# Patient Record
Sex: Female | Born: 1940
Health system: Southern US, Community
[De-identification: ages and names within clinical notes are randomized; demographics above are authoritative.]

## PROBLEM LIST (undated history)

## (undated) DIAGNOSIS — R7303 Prediabetes: Secondary | ICD-10-CM

## (undated) DIAGNOSIS — F419 Anxiety disorder, unspecified: Secondary | ICD-10-CM

## (undated) DIAGNOSIS — I10 Essential (primary) hypertension: Secondary | ICD-10-CM

## (undated) DIAGNOSIS — E7800 Pure hypercholesterolemia, unspecified: Secondary | ICD-10-CM

## (undated) DIAGNOSIS — F32A Depression, unspecified: Secondary | ICD-10-CM

## (undated) DIAGNOSIS — E039 Hypothyroidism, unspecified: Secondary | ICD-10-CM

## (undated) DIAGNOSIS — R112 Nausea with vomiting, unspecified: Secondary | ICD-10-CM

## (undated) DIAGNOSIS — Z9889 Other specified postprocedural states: Secondary | ICD-10-CM

## (undated) DIAGNOSIS — M1990 Unspecified osteoarthritis, unspecified site: Secondary | ICD-10-CM

## (undated) HISTORY — PX: KNEE ARTHROSCOPY: SUR90

## (undated) HISTORY — DX: Unspecified osteoarthritis, unspecified site: M19.90

## (undated) HISTORY — DX: Essential (primary) hypertension: I10

## (undated) HISTORY — PX: ANTERIOR CERVICAL DECOMP/DISCECTOMY FUSION: SHX1161

## (undated) HISTORY — PX: VAGINAL HYSTERECTOMY: SHX2639

## (undated) HISTORY — PX: CATARACT EXTRACTION: SUR2

## (undated) HISTORY — PX: GALLBLADDER SURGERY: SHX652

## (undated) HISTORY — DX: Pure hypercholesterolemia, unspecified: E78.00

## (undated) HISTORY — PX: VITRECTOMY: SHX106

---

## 2007-02-06 ENCOUNTER — Inpatient Hospital Stay (HOSPITAL_COMMUNITY): Admission: RE | Admit: 2007-02-06 | Discharge: 2007-02-10 | Payer: Self-pay | Admitting: Orthopedic Surgery

## 2010-10-18 NOTE — Op Note (Signed)
Kayla Bruce, Kayla Bruce NO.:  [id number]   MEDICAL RECORD NO.:  [id number]          PATIENT TYPE:  INP   LOCATION:  X002                         FACILITY:  [facility]   PHYSICIAN:  Georges Lynch. Gioffre, M.D.DATE OF BIRTH:  04-14-1941   DATE OF PROCEDURE:  02/06/2007  DATE OF DISCHARGE:                               OPERATIVE REPORT   SURGEON:  Georges Lynch. Darrelyn Hillock, M.D.   ASSISTANT:  Jamelle Rushing, PA.   PREOPERATIVE DIAGNOSIS:  Severe degenerative arthritis of the right knee  with a genu valgus deformity.   POSTOPERATIVE DIAGNOSIS:  Severe degenerative arthritis of the right  knee with a genu valgus deformity.   OPERATION:  Right total knee arthroplasty.  All three components were  cemented.  I utilized the [employer] system with a rotating platform.  1 gram  of vancomycin was added to the cement.  Note, the sizes used the patella  was a size 32 mm with three pegs.  The femoral component was a size 2.5  right posterior cruciate sacrificing.  Tibial insert was a 2.5 10 mm  thickness.  Tibial tray was a size 2.5 mm.  We did utilize a Scientist, research (life sciences).   DESCRIPTION OF PROCEDURE:  Under general anesthesia, routine orthopedic  prep and draping of the right lower extremity was carried out.  The  patient had 1 gram vancomycin IV.  At this time the leg was  exsanguinated and Esmarch tourniquet was elevated 350 mmHg.  Following  that, an incision was made over the anterior aspect of the right knee.  Bleeders identified and cauterized.  I then created a median  parapatellar incision reflecting the patella laterally and flexed the  knee and did medial and lateral meniscectomies and excised the anterior  and posterior cruciate ligaments.  I freed up the lateral border as best  I could since he had a valgus deformity.  Then I did a synovectomy.  Initial drill hole was made in the intercondylar notch.  The #1 jig was  inserted and 10 mm thickness was removed the distal femur.  Next,  the #2  jig was inserted for a size 2.5 mm femoral component.  We did anterior,  posterior and chamfering cuts.  We then prepared the tibia.  We removed  the appropriate amount of tibia surface off with the oscillating saw by  utilizing the intramedullary guide.  Following that, we made our keel  cut in the proximal tibia.  We then cut our notch cut out of the distal  femur.  We then inserted our spacers after we checked to make sure we  had no posterior osteophytes.  We removed a small posterior osteophyte  from the lateral condyle.  After this, we then inserted our trial  components and had good stability.  We had good motion.  Following that,  we then did a resurfacing procedure on the patella for a size 32  patella.  Three drill holes was made in the articular surface of the  patella.  We then removed all trial components, thoroughly water picked  out the knee and cemented all three  components in simultaneously.  Vancomycin was used in the cement.  Following that, we then removed  loose pieces of cement.  We thoroughly water picked the knee out again.  I injected 30 mg of Toradol with 0.25% Marcaine with epinephrine into  the soft tissue structures.  Following that, we then searched again and  made sure there were no other loose pieces of cement.  There were not.  The prosthesis was stable.  We went through motion again.  We had  excellent flexion, excellent extension and good lateral medial  stability.  Following that, we water picked out the  knee again.  I inserted FloSeal 10 cc.  I let the tourniquet down and no  drain was used.  We searched for bleeders.  Following that, the wound  was closed in layers in usual fashion.  Skin was closed with metal  staples.  A sterile Neosporin bundle dressing was applied.           ______________________________  Georges Lynch Darrelyn Hillock, M.D.     RAG/MEDQ  D:  02/06/2007  T:  02/06/2007  Job:  [id number]

## 2010-10-18 NOTE — H&P (Signed)
Kayla Bruce, Kayla Bruce             ACCOUNT NO.:  [id number]   MEDICAL RECORD NO.:  [id number]          PATIENT TYPE:  INP   LOCATION:  NA                           FACILITY:  [facility]   PHYSICIAN:  Georges Lynch. Gioffre, M.D.DATE OF BIRTH:  [birth year]   DATE OF ADMISSION:  02/06/2007  DATE OF DISCHARGE:                              HISTORY & PHYSICAL   CHIEF COMPLAINT:  Painful right knee.   HISTORY OF PRESENT ILLNESS:  The patient is a 70 year old female who has  been evaluated by Dr. Darrelyn Hillock for chronic right knee pain.  Patient has  failed conservative treatment.  Has severe osteoarthritis with bone-on-  bone lateral compartment of her right knee with valgus deformity and she  continues to have pain and difficulty with range of motion.  Patient has  elected to proceed with a total knee arthroplasty.   ALLERGIES:  1. PENICILLIN.  2. SULFA.  3. CIPRO.  4. LEVAQUIN.  5. CEPHALEXIN.  6. CODEINE.   CURRENT MEDICATIONS:  1. Micardis 12.5 mg a day.  2. Tenormin 50 mg a day.  3. Prilosec 20 mg once or twice a day.  4. Flonase two sprays each naris a day.  5. Xanax 0.5 mg twice a day.  6. Zoloft 50 mg a day.  7. Halcion 0.25 mg p.r.n.   PAST MEDICAL HISTORY:  1. Osteoarthritis bilateral knees, right greater than left.  2. Anxiety/depression with panic disorder.  3. Hypertension.  4. Hiatal hernia/reflux disease.  5. Frequent urinary tract infections.  6. Generalized osteoarthritis.   REVIEW OF SYSTEMS:  Negative for any neurologic complaints other than  her fairly well controlled panic disorder.  She denies any respiratory  issues.  She denies any chest pains on palpation.  She has had a stress  test done several years ago but was unable to complete due to knee pain.  Did not have a chemical stress test.  She denies any GI issues as her  GERD is well controlled with Prilosec.  She does admit to multiple  urinary tract infections in the past and she has been treated with  Macrobid and doxycycline.  No endocrine issues or hematologic issues.   PAST SURGICAL HISTORY:  1. Hysterectomy.  2. Cholecystectomy.  3. Cataract removal left eye.  4. No complications with anesthesia.   FAMILY MEDICAL HISTORY:  Mother is alive at 35 years of age.  She does  have heart disease, hypertension, diabetes, history of a stroke and  arthritis.  Father is deceased from complications of Alzheimer's disease  and also a history of stroke.   SOCIAL HISTORY:  Patient is married, she is currently retired.  She does  not smoke.  She will have an occasional alcoholic beverage.  She has one  child.  She lives with her husband who is caregiver, 1-story house.   PHYSICAL EXAM:  Height is 5 feet 2 inches, weight is 150, blood pressure  is 144/78, pulse of 70 and regular, respirations 12, patient is  afebrile.  GENERAL:  This is a healthy appearing well-developed female conscious,  alert and appropriate.  Nice slow, easy, balanced  gait.  Easily gets on  and off the exam table.  HEENT:  Head is normocephalic.  Pupils equal, round, reactive,  extraocular movements intact.  Oral buccal mucosa is pink and moist.  Gross hearing is intact.  NECK:  Supple, no palpable lymphadenopathy.  She had good range of  motion of her cervical spine.  CHEST:  Lung sounds were clear and equal bilaterally.  No wheezes, rales  or rhonchi.  HEART:  Regular rate and rhythm.  No murmurs, rubs or gallops.  ABDOMEN:  Soft, bowel sounds present.  EXTREMITIES:  Upper extremities had good range of motion of her  shoulders, elbow and wrist.  Motor strength was 5/5.  LOWER EXTREMITIES:  Right and left hip had full extension, flexion up to  130, 20 to 30 degrees internal and external rotation without any  discomfort.  Bilateral knees were round, boggy appearing, no effusions,  no signs of infections.  Right knee she was very gingerly able to fully  extend and flex it back to 120 degrees.  Left knee easily full  extended  and flexed easily back to 120 degrees.  She did have some laxity with  valgus and varus stressing but no instability.  Calves were soft,  nontender.  Ankles were symmetrical with good dorsoplantar flexion.  PERIPHERAL VASCULAR:  Carotid pulses were 2+, no bruits, radial pulses  were 2+, dorsalis pedis pulses 1+.  NEURO:  The patient was conscious, alert and appropriate.  No gross  neurologic defects noted.  BREAST/RECTAL/GU EXAMS:  Deferred at this time.   IMPRESSION:  1. End-stage osteoarthritis right knee with valgus deformity, bone-on-      bone, lateral compartment.  2. Hypertension.  3. Anxiety/depression/panic disorder, stable.  4. History of hiatal hernia/reflux disease.  5. History of frequent urinary tract infections.   PLAN:  The patient will undergo all routine labs and tests prior to  having a right total knee arthroplasty by Dr. Darrelyn Hillock at [facility] on February 06, 2007.  Patient will undergo all routine labs  and tests.  Patient had all questions answered and review of total knee  arthroplasty with its pros, cons and possible complications and elected  to proceed.      Jamelle Rushing, P.A.    ______________________________  Georges Lynch Darrelyn Hillock, M.D.    RWK/MEDQ  D:  01/28/2007  T:  01/29/2007  Job:  [id number]

## 2010-10-21 NOTE — Discharge Summary (Signed)
Kayla Bruce, HUNKINS NO.:  [id number]   MEDICAL RECORD NO.:  [id number]          PATIENT TYPE:  INP   LOCATION:  1613                         FACILITY:  [facility]   PHYSICIAN:  Kayla Bruce, M.D.DATE OF BIRTH:  January 05, 1941   DATE OF ADMISSION:  02/06/2007  DATE OF DISCHARGE:  02/10/2007                               DISCHARGE SUMMARY   ADMISSION DIAGNOSES:  1. End-stage osteoarthritis right knee with valgus deformity bone-on-      bone lateral compartment.  2. Hypertension.  3. Anxiety/depression/panic disorder.  4. History of hiatal hernia/reflux disease.  5. History of frequent urinary tract infections.   DISCHARGE DIAGNOSES:  1. Right total knee arthroplasty.  2. Acute postoperative blood loss anemia, receiving 2 units of packed      red blood cells.  3. Mild hyponatremia, asymptomatic.  4. Mild hypokalemia, resolved.  5. Postoperative pressure blisters secondary to hemarthrosis.  6. History of hypertension.  7. History of anxiety/depression/panic disorder.  8. History of hiatal hernia/reflux disease.  9. History of frequent urinary tract infections.   HISTORY OF PRESENT ILLNESS:  The patient is a 70 year old female who was  evaluated by Dr. Darrelyn Bruce for chronic right knee pain, failed  conservative treatment.  X-rays revealed severe osteoarthritis with bone-  on-bone lateral compartment.  The patient elected to proceed with a  total knee arthroplasty.   ALLERGIES:  1. PENICILLIN.  2. SULFA.  3. CIPRO.  4. LEVOXINE.  5. CEPHALEXIN.  6. CODEINE.   MEDICATIONS ON ADMISSION:  1. Micardis 12.5 mg a day.  2. Tenormin 50 mg a day.  3. Prilosec 20 mg once or twice a day.  4. Flonase two sprays each naris once a day.  5. Xanax 0.5 mg twice a day.  6. Zoloft 150 mg a day.  7. Halcion 0.25 mg p.r.n.   SURGICAL PROCEDURE:  On February 06, 2007, the patient was taken to the  OR by Dr. Worthy Rancher, assisted by Oneida Alar, PA-C.  Under general  anesthesia, the patient underwent a right total knee arthroplasty with a  [employer] rotating platform system.  The patient tolerated the procedure  well, there were no complications.  The patient had the following  components implanted:  A size 2.5 right femoral component, a size 2.5  cemented keeled tibial tray, a size 2.5 10-mm polyethylene bearing, size  32 three-peg patella.  All components were implanted with  polymethylmethacrylate with vancomycin mixed in.   CONSULTS:  The following routine consults requested:  Physical therapy,  occupational therapy, case management, pharmacy for Coumadin dosing.   HOSPITAL COURSE:  On February 06, 2007, the patient was admitted to  [facility] under the care of Dr. Worthy Rancher.  The patient was  taken to the OR where a right total knee arthroplasty was performed  without any complications.  The patient was transferred to the recovery  room and then to orthopedic floor in good condition.  The patient was  started on a routine total knee protocol with IV antibiotics, pain  medicines and Coumadin for DVT prophylaxis.   The patient incurred some postoperative  blood loss anemia with her  hemoglobin dropped to 8.5.  She was typed, crossed and transfused 2  units of packed red blood cells with improvement to 10.2/28.9.  The  patient's wound remained benign for any signs infection but she did  develop some significant large pressure blisters from a hemarthrosis.  These were kept clean and covered and not develop any signs of  infection.  The patient also developed some mild hypokalemia and  hyponatremia.  Hypokalemia was corrected with p.o. supplements of  potassium.  Hyponatremia was corrected with just fluid adjustments and  the patient remained asymptomatic.  The patient's vital signs remained  stable.  The patient worked well with physical therapy and on  postoperative day #4 the patient was felt to be medically and  orthopedically stable,  ready for discharge home, and she was discharged  in good condition with outpatient home health physical therapy.   LABORATORIES:  CBC on admission found wbc's 2.9, hemoglobin 12.7,  hematocrit 36.9, platelets 226.  Her hemoglobin dropped to 8.5/24.6  initially postoperatively.  She received 2 units of packed red blood  cells.  She improved to 10.2/28.9.  On discharge, her H&H was 9.3/26.5.  INR was 1.4 on Coumadin on discharge.  Routine chemistries on discharge  found sodium of 130; potassium of 3.2; glucose 131, labile during her  hospital stay due to inactivity, no medication adjustments; BUN 5;  creatinine 0.67.  The estimated GFR was greater than 60.  The patient  received a total of 2 units of packed red blood cells during her  hospitalization.  Chest x-ray on admission showed hiatal hernia but no  active disease.   DISCHARGE INSTRUCTIONS:  1. Diet:  The patient is to resume a regular diet.  2. Activity:  The patient is to ambulate with the use of a walker.  3. Wound care:  The patient should change her dressing on a daily      basis as instructed.  4. Followup:  The patient is to have a followup appointment with Dr.      Darrelyn Bruce 2 weeks from date of discharge.  The patient is to call [phone] for an appointment.  5. Home health physical therapy through [facility] and to provide      pharmacy management of Coumadin.   MEDICATIONS:  1. Zoloft 50 mg a day.  2. Halcion 0.25 mg at bedtime as needed.  3. Atenolol 50 mg a day.  4. Alprazolam 0.5 mg twice a day.  5. Flonase nasal spray one spray at night.  6. Prilosec 20 mg once or twice a day as needed.  7. Micardis 80/12.5 once a day.  8. Os-Cal 500 plus D two tablets a day.  9. Mag-Ox 400 once a day.  10.Dramamine as needed.  11.Excedrin two tablets as needed.  12.Coumadin 5 mg one-and-a-half tablets a day unless changed by      pharmacy.  13.Robaxin 500 mg one tablet every 6 hours for muscle spasms if      needed.   14.Mepergan fortis one tablet every 6 hours for pain as needed.   CONDITION UPON DISCHARGE TO HOME:  Listed as improved and good.      Kayla Bruce, P.A.    ______________________________  Kayla Lynch Kayla Bruce, M.D.    RWK/MEDQ  D:  02/27/2007  T:  02/27/2007  Job:  [id number]

## 2011-03-17 LAB — COMPREHENSIVE METABOLIC PANEL
ALT: 18
AST: 20
Albumin: 3.9
Alkaline Phosphatase: 62
Calcium: 9.8
GFR calc Af Amer: >60
Glucose, Bld: 102 — ABNORMAL HIGH
Potassium: 4.1
Sodium: 139
Total Protein: 8.4 — ABNORMAL HIGH

## 2011-03-17 LAB — PROTIME-INR
INR: 1.1
INR: 1.4
Prothrombin Time: 17 — ABNORMAL HIGH

## 2011-03-17 LAB — CBC
HCT: 26.8 — ABNORMAL LOW
HCT: 28.9 — ABNORMAL LOW
Hemoglobin: 10.2 — ABNORMAL LOW
Hemoglobin: 8.5 — ABNORMAL LOW
Hemoglobin: 9.3 — ABNORMAL LOW
Hemoglobin: 12.7
MCHC: 34.7
MCHC: 34.7
Platelets: 226
RBC: 2.67 — ABNORMAL LOW
RBC: 3.19 — ABNORMAL LOW
RDW: 14.5 — ABNORMAL HIGH
RDW: 13.9
WBC: 4.6

## 2011-03-17 LAB — PREPARE RBC (CROSSMATCH)

## 2011-03-17 LAB — TYPE AND SCREEN

## 2011-03-17 LAB — DIFFERENTIAL
Basophils Relative: 1
Eosinophils Absolute: 0
Eosinophils Relative: 0
Eosinophils Relative: 1
Lymphocytes Relative: 12
Lymphs Abs: 0.5 — ABNORMAL LOW
Lymphs Abs: 1.1
Monocytes Absolute: 0.4
Monocytes Absolute: 0.3
Monocytes Relative: 9
Monocytes Relative: 9

## 2011-03-17 LAB — BASIC METABOLIC PANEL
CO2: 31
GFR calc non Af Amer: >60
Glucose, Bld: 130 — ABNORMAL HIGH
Glucose, Bld: 131 — ABNORMAL HIGH
Potassium: 2.9 — ABNORMAL LOW
Potassium: 3.2 — ABNORMAL LOW
Sodium: 128 — ABNORMAL LOW
Sodium: 130 — ABNORMAL LOW

## 2011-03-17 LAB — URINALYSIS, ROUTINE W REFLEX MICROSCOPIC
Ketones, ur: NEGATIVE
Nitrite: NEGATIVE
Urobilinogen, UA: 0.2
pH: 6

## 2011-03-17 LAB — HEMOGLOBIN AND HEMATOCRIT, BLOOD
Hemoglobin: 10.7 — ABNORMAL LOW
Hemoglobin: 9.3 — ABNORMAL LOW

## 2013-11-24 ENCOUNTER — Other Ambulatory Visit (HOSPITAL_COMMUNITY): Payer: Self-pay | Admitting: Internal Medicine

## 2013-11-24 DIAGNOSIS — R928 Other abnormal and inconclusive findings on diagnostic imaging of breast: Secondary | ICD-10-CM

## 2013-12-15 ENCOUNTER — Ambulatory Visit (HOSPITAL_COMMUNITY)
Admission: RE | Admit: 2013-12-15 | Discharge: 2013-12-15 | Disposition: A | Discharge status: Home / Self Care | Payer: Medicare PPO | Source: Ambulatory Visit | Attending: Internal Medicine | Admitting: Internal Medicine

## 2013-12-15 DIAGNOSIS — R928 Other abnormal and inconclusive findings on diagnostic imaging of breast: Secondary | ICD-10-CM

## 2013-12-15 DIAGNOSIS — Z803 Family history of malignant neoplasm of breast: Secondary | ICD-10-CM | POA: Insufficient documentation

## 2013-12-15 LAB — POCT I-STAT CREATININE: CREATININE: 1.2 mg/dL — AB (ref 0.50–1.10)

## 2013-12-15 MED ORDER — GADOBENATE DIMEGLUMINE 529 MG/ML IV SOLN
15.0000 mL | Freq: Once | INTRAVENOUS | Status: AC | PRN
  Administered 2013-12-15: 14 mL via INTRAVENOUS

## 2015-10-05 ENCOUNTER — Other Ambulatory Visit (HOSPITAL_COMMUNITY): Payer: Self-pay | Admitting: Rheumatology

## 2015-10-05 DIAGNOSIS — M545 Low back pain: Secondary | ICD-10-CM

## 2015-10-05 DIAGNOSIS — M5416 Radiculopathy, lumbar region: Secondary | ICD-10-CM

## 2015-10-13 ENCOUNTER — Ambulatory Visit (HOSPITAL_COMMUNITY)
Admission: RE | Admit: 2015-10-13 | Discharge: 2015-10-13 | Disposition: A | Discharge status: Home / Self Care | Payer: Medicare Other | Source: Ambulatory Visit | Attending: Rheumatology | Admitting: Rheumatology

## 2015-10-13 ENCOUNTER — Encounter (HOSPITAL_COMMUNITY): Payer: Self-pay | Admitting: Radiology

## 2015-10-13 DIAGNOSIS — M5416 Radiculopathy, lumbar region: Secondary | ICD-10-CM | POA: Insufficient documentation

## 2015-10-13 DIAGNOSIS — M4806 Spinal stenosis, lumbar region: Secondary | ICD-10-CM | POA: Insufficient documentation

## 2015-10-13 DIAGNOSIS — M545 Low back pain: Secondary | ICD-10-CM | POA: Diagnosis present

## 2015-10-13 DIAGNOSIS — M47896 Other spondylosis, lumbar region: Secondary | ICD-10-CM | POA: Diagnosis not present

## 2016-03-30 ENCOUNTER — Telehealth: Payer: Self-pay | Admitting: Rheumatology

## 2016-03-30 NOTE — Telephone Encounter (Signed)
Pt's pseudogout was affecting her and she was not taking colchicine. On last visit, I advised pt to restart it. She was agreeable.  I am glad to know colchine is helping. Pls contineu colchicine.

## 2016-03-30 NOTE — Telephone Encounter (Signed)
Patient called and states that cholchicine is helping. She has been able to tell a great difference since being on the medication. Patient states Panwala wanted to know how she was doing on the medication before a refill was made.

## 2016-05-01 ENCOUNTER — Other Ambulatory Visit: Payer: Self-pay | Admitting: Rheumatology

## 2016-05-01 ENCOUNTER — Telehealth: Payer: Self-pay | Admitting: Radiology

## 2016-05-01 MED ORDER — COLCHICINE 0.6 MG PO TABS: 0.6000 mg | ORAL_TABLET | Freq: Every day | ORAL | 0 refills | Status: DC

## 2016-05-01 NOTE — Telephone Encounter (Signed)
Patient states she will have the labs faxed to me, she just had them done with her PCP Ok to refill per Dr Estanislado Pandy  Next visit 06/13/16

## 2016-05-01 NOTE — Telephone Encounter (Signed)
Labs due for refill will call patient

## 2016-06-13 ENCOUNTER — Ambulatory Visit: Payer: Self-pay | Admitting: Rheumatology

## 2016-06-13 ENCOUNTER — Ambulatory Visit (INDEPENDENT_AMBULATORY_CARE_PROVIDER_SITE_OTHER): Payer: Medicare Other | Admitting: Rheumatology

## 2016-06-13 ENCOUNTER — Encounter: Payer: Self-pay | Admitting: Rheumatology

## 2016-06-13 VITALS — BP 128/78 | HR 76 | Resp 14 | Ht 60.0 in | Wt 160.0 lb

## 2016-06-13 DIAGNOSIS — Z79899 Other long term (current) drug therapy: Secondary | ICD-10-CM | POA: Diagnosis not present

## 2016-06-13 DIAGNOSIS — L408 Other psoriasis: Secondary | ICD-10-CM

## 2016-06-13 DIAGNOSIS — M62838 Other muscle spasm: Secondary | ICD-10-CM | POA: Diagnosis not present

## 2016-06-13 DIAGNOSIS — M1120 Other chondrocalcinosis, unspecified site: Secondary | ICD-10-CM

## 2016-06-13 MED ORDER — FOLIC ACID 1 MG PO TABS: 1.0000 mg | ORAL_TABLET | Freq: Every day | ORAL | 4 refills | Status: AC

## 2016-06-13 MED ORDER — ONDANSETRON HCL 4 MG PO TABS: 4.0000 mg | ORAL_TABLET | Freq: Three times a day (TID) | ORAL | 0 refills | Status: DC | PRN

## 2016-06-13 MED ORDER — TRIAMCINOLONE ACETONIDE 40 MG/ML IJ SUSP
10.0000 mg | INTRAMUSCULAR | Status: AC | PRN
  Administered 2016-06-13: 10 mg via INTRAMUSCULAR

## 2016-06-13 MED ORDER — LIDOCAINE HCL 1 % IJ SOLN
0.5000 mL | INTRAMUSCULAR | Status: AC | PRN
  Administered 2016-06-13: .5 mL

## 2016-06-13 MED ORDER — COLCHICINE 0.6 MG PO TABS: 0.6000 mg | ORAL_TABLET | Freq: Every day | ORAL | 1 refills | Status: AC

## 2016-06-13 MED ORDER — METHOTREXATE 2.5 MG PO TABS: 10.0000 mg | ORAL_TABLET | ORAL | 0 refills | Status: AC

## 2016-06-13 NOTE — Progress Notes (Signed)
Office Visit Note  Patient: Kayla Bruce             Date of Birth: [birth year]           MRN: [id number]             PCP: Neale Burly, MD Referring: Neale Burly, MD Visit Date: 06/13/2016 Occupation: @GUAROCC @    Subjective:  Follow-up (feels better on Cochicine) Follow-up on psoriasis, CPPD, high-risk prescription, neck pain  History of Present Illness: Kayla Bruce is a 76 y.o. female  Last seen 02/28/2016.  Patient has psoriasis and she is doing really well. No flare. She gets methotrexate from her PCP Dr. Amanda Pea. At the last visit patient was on 4 pills per week. On this visit patient states that she is supposed to be taking 6 pills per week but she thinks that that too strong of a dose and she is taking 4 pills per week. I asked her what day she takes her medication on and she states that she takes it one every other day. I verified this with the patient and educated her on the proper way of taking methotrexate. In fact she wants [place] to take over prescribing this medication for her and I am agreeable. She decides to take this medication now on Wednesdays and she'll take 4 pills at one time right before dinner.  She has a history of nausea and I've given her Zofran to take an hour before she takes her methotrexate and then every 8 hours when necessary nausea. I've given her 30 pills of the medication with no refill.  Currently her main complaint is that she is having some neck pain. The right side is worse than the left side. I offered her cortisone injection and patient is agreeable  She has history of CPPD and doing well with that. It affected her left knee joint in the past but she's been taking colchicine regularly every day and doing well.    Activities of Daily Living:  Patient reports morning stiffness for 15 minutes.   Patient Reports nocturnal pain.  Difficulty dressing/grooming: Reports Difficulty climbing stairs: Denies Difficulty getting out of chair:  Denies Difficulty using hands for taps, buttons, cutlery, and/or writing: Denies   Review of Systems  Constitutional: Negative for fatigue.  HENT: Negative for mouth sores and mouth dryness.   Eyes: Negative for dryness.  Respiratory: Negative for shortness of breath.   Gastrointestinal: Negative for constipation and diarrhea.  Musculoskeletal: Negative for myalgias and myalgias.  Skin: Negative for sensitivity to sunlight.  Psychiatric/Behavioral: Negative for decreased concentration and sleep disturbance.    PMFS History:  There are no active problems to display for this patient.   No past medical history on file.  No family history on file. Past Surgical History:  Procedure Laterality Date  . CATARACT EXTRACTION     left 2008  . [place]  . KNEE ARTHROSCOPY     rt knee 2008  . VAGINAL HYSTERECTOMY     1979  . VITRECTOMY     left eye 2009, right eye 2012   Social History   Social History Narrative  . No narrative on file     Objective: Vital Signs: BP 128/78   Pulse 76   Resp 14   Ht 5' (1.524 m)   Wt 160 lb (72.6 kg)   BMI 31.25 kg/m    Physical Exam  Constitutional: She is oriented to person, place, and  time. She appears well-developed and well-nourished.  HENT:  Head: Normocephalic and atraumatic.  Eyes: EOM are normal. Pupils are equal, round, and reactive to light.  Cardiovascular: Normal rate, regular rhythm and normal heart sounds.  Exam reveals no gallop and no friction rub.   No murmur heard. Pulmonary/Chest: Effort normal and breath sounds normal. She has no wheezes. She has no rales.  Abdominal: Soft. Bowel sounds are normal. She exhibits no distension. There is no tenderness. There is no guarding. No hernia.  Musculoskeletal: Normal range of motion. She exhibits no edema, tenderness or deformity.  Lymphadenopathy:    She has no cervical adenopathy.  Neurological: She is alert and oriented to person, place, and time.  Coordination normal.  Skin: Skin is warm and dry. Capillary refill takes less than 2 seconds. No rash noted.  Psychiatric: She has a normal mood and affect. Her behavior is normal.  Nursing note and vitals reviewed.    Musculoskeletal Exam:  Full range of motion of all joints Grip strength is equal and strong bilaterally Fiber myalgia tender points are  CDAI Exam: No CDAI exam completed.   No synovitis on exam  Investigation: No additional findings.   Imaging: No results found.  Speciality Comments: No specialty comments available.    Procedures:  Trigger Point Inj Date/Time: 06/13/2016 3:37 PM Performed by: Eliezer Lofts Authorized by: Eliezer Lofts   Consent Given by:  Patient Site marked: the procedure site was marked   Timeout: prior to procedure the correct patient, procedure, and site was verified   Indications:  Muscle spasm and pain Total # of Trigger Points:  2 Location: neck   Needle Size:  27 G Approach:  Dorsal Medications #1:  0.5 mL lidocaine 1 %; 10 mg triamcinolone acetonide 40 MG/ML Medications #2:  0.5 mL lidocaine 1 %; 10 mg triamcinolone acetonide 40 MG/ML Patient tolerance:  Patient tolerated the procedure well with no immediate complications Comments: Patient rated her bilateral trapezius muscle spasm pain to be about 6 prior to the injection. Couple of minutes after the injection she rated the pain to be a 0. There were no complications   Allergies: Ceclor [cefaclor]; Ciprofloxacin hcl; Codeine; Levaquin [levofloxacin in d5w]; Penicillins; and Sulfa antibiotics   Assessment / Plan:     Visit Diagnoses: Other psoriasis  Pseudogout  High risk medications (not anticoagulants) long-term use - Patient on methotrexate 4 per week, folic acid 1 per day: Prescribed by Dr. Amanda Pea. - Plan: CBC with Differential/Platelet, COMPLETE METABOLIC PANEL WITH GFR   Plan: #1: Patient is doing well with psoriasis.  #2: She is on methotrexate 4 per  week prescribed by her PCP. However instead of taking 4 pills at one time once a week, she's taking 1 pill every other day for total of 4 pills per week. We had a long discussion on the proper way of taking methotrexate and patient is agreeable. She will also take folic acid 1 pill a day  #3: Bilateral trapezius muscle spasms with right worse than left. I've offered her trigger point injections and she is agreeable for bilateral trapezius muscles. I gave her 10 mg of Kenalog mixed with 0.5 mL's 1% lidocaine. Patient tolerated procedure well. Note that her pain was about a 6 before she started her injection and she was at a 0 couple of minutes after the injection  #4: CBC with differential CMP with GFR today  #5: Return to clinic in 2 months. I want to check and see that she's taking  her methotrexate properly, prescribed more Zofran if needed.   Orders: Orders Placed This Encounter  Procedures  . Trigger Point Injection  . CBC with Differential/Platelet  . COMPLETE METABOLIC PANEL WITH GFR   Meds ordered this encounter  Medications  . ondansetron (ZOFRAN) 4 MG tablet    Sig: Take 1 tablet (4 mg total) by mouth every 8 (eight) hours as needed for nausea or vomiting.    Dispense:  30 tablet    Refill:  0    Order Specific Question:   Supervising Provider    Answer:   Bo Merino [[id number]]  . colchicine 0.6 MG tablet    Sig: Take 1 tablet (0.6 mg total) by mouth daily.    Dispense:  90 tablet    Refill:  1    Order Specific Question:   Supervising Provider    Answer:   Bo Merino [[id number]]  . methotrexate (RHEUMATREX) 2.5 MG tablet    Sig: Take 4 tablets (10 mg total) by mouth once a week. Caution:Chemotherapy. Protect from light.    Dispense:  48 tablet    Refill:  0    Order Specific Question:   Supervising Provider    Answer:   Bo Merino [phone]    Face-to-face time spent with patient was 40 minutes. 50% of time was spent in counseling and coordination of  care.  Follow-Up Instructions: Return in about 5 months (around 11/11/2016) for ps, cppd, mtx 4/wk folic 1/d by dr Amanda Pea, ddd .   Eliezer Lofts, PA-C  Note - This record has been created using [employer].  Chart creation errors have been sought, but may not always  have been located. Such creation errors do not reflect on  the standard of medical care.

## 2016-06-14 LAB — COMPLETE METABOLIC PANEL WITH GFR
ALBUMIN: 4 g/dL (ref 3.6–5.1)
ALK PHOS: 57 U/L (ref 33–130)
ALT: 27 U/L (ref 6–29)
AST: 28 U/L (ref 10–35)
BUN: 12 mg/dL (ref 7–25)
CALCIUM: 10 mg/dL (ref 8.6–10.4)
CO2: 26 mmol/L (ref 20–31)
CREATININE: 0.88 mg/dL (ref 0.60–0.93)
Chloride: 93 mmol/L — ABNORMAL LOW (ref 98–110)
GFR, Est African American: 74 mL/min (ref >=60)
GFR, Est Non African American: 64 mL/min (ref >=60)
Glucose, Bld: 88 mg/dL (ref 65–99)
POTASSIUM: 4.7 mmol/L (ref 3.5–5.3)
Sodium: 131 mmol/L — ABNORMAL LOW (ref 135–146)
TOTAL PROTEIN: 7.3 g/dL (ref 6.1–8.1)
Total Bilirubin: 0.4 mg/dL (ref 0.2–1.2)

## 2016-06-14 LAB — CBC WITH DIFFERENTIAL/PLATELET
Basophils Absolute: 0 cells/uL (ref 0–200)
Basophils Relative: 0 %
Eosinophils Absolute: 37 cells/uL (ref 15–500)
Eosinophils Relative: 1 %
HEMATOCRIT: 39.9 % (ref 35.0–45.0)
HEMOGLOBIN: 13.1 g/dL (ref 11.7–15.5)
LYMPHS ABS: 1110 {cells}/uL (ref 850–3900)
Lymphocytes Relative: 30 %
MCH: 33.1 pg — ABNORMAL HIGH (ref 27.0–33.0)
MCHC: 32.8 g/dL (ref 32.0–36.0)
MCV: 100.8 fL — ABNORMAL HIGH (ref 80.0–100.0)
MPV: 9.6 fL (ref 7.5–12.5)
Monocytes Absolute: 407 cells/uL (ref 200–950)
Monocytes Relative: 11 %
NEUTROS PCT: 58 %
Neutro Abs: 2146 cells/uL (ref 1500–7800)
Platelets: 284 10*3/uL (ref 140–400)
RBC: 3.96 MIL/uL (ref 3.80–5.10)
RDW: 15.1 % — ABNORMAL HIGH (ref 11.0–15.0)
WBC: 3.7 10*3/uL — AB (ref 3.8–10.8)

## 2016-08-08 ENCOUNTER — Ambulatory Visit: Payer: Medicare Other | Admitting: Rheumatology

## 2016-08-15 ENCOUNTER — Ambulatory Visit: Payer: Medicare Other | Admitting: Rheumatology

## 2016-12-12 ENCOUNTER — Ambulatory Visit: Payer: Medicare Other | Admitting: Rheumatology

## 2017-11-23 ENCOUNTER — Other Ambulatory Visit: Payer: Self-pay | Admitting: Orthopedic Surgery

## 2017-11-23 DIAGNOSIS — G8929 Other chronic pain: Secondary | ICD-10-CM

## 2017-11-23 DIAGNOSIS — M545 Low back pain: Principal | ICD-10-CM

## 2017-11-26 ENCOUNTER — Telehealth: Payer: Self-pay | Admitting: Nurse Practitioner

## 2017-11-26 NOTE — Telephone Encounter (Signed)
Phone call to patient to verify medication list and allergies for myelogram procedure. Pt informed to stop zoloft 48hrs prior to myelogram procedure. Pt verbalized understanding.

## 2017-12-04 ENCOUNTER — Ambulatory Visit
Admission: RE | Admit: 2017-12-04 | Discharge: 2017-12-04 | Disposition: A | Discharge status: Home / Self Care | Payer: Medicare Other | Source: Ambulatory Visit | Attending: Orthopedic Surgery | Admitting: Orthopedic Surgery

## 2017-12-04 DIAGNOSIS — M545 Low back pain: Principal | ICD-10-CM

## 2017-12-04 DIAGNOSIS — G8929 Other chronic pain: Secondary | ICD-10-CM

## 2017-12-04 MED ORDER — DIAZEPAM 5 MG PO TABS
5.0000 mg | ORAL_TABLET | Freq: Once | ORAL | Status: AC
  Administered 2017-12-04: 5 mg via ORAL

## 2017-12-04 MED ORDER — IOPAMIDOL (ISOVUE-M 200) INJECTION 41%
1.0000 mL | Freq: Once | INTRAMUSCULAR | Status: AC
  Administered 2017-12-04: 1 mL via EPIDURAL

## 2017-12-04 NOTE — Discharge Instructions (Signed)

## 2017-12-04 NOTE — Progress Notes (Signed)
Patient states she has been off Zoloft for at least the past two days. 

## 2017-12-25 ENCOUNTER — Telehealth (INDEPENDENT_AMBULATORY_CARE_PROVIDER_SITE_OTHER): Payer: Self-pay | Admitting: Rheumatology

## 2017-12-25 NOTE — Telephone Encounter (Signed)
2017 L-Spine MRI report faxed to [facility] [phone], ph [phone]

## 2019-07-07 DIAGNOSIS — Z6829 Body mass index (BMI) 29.0-29.9, adult: Secondary | ICD-10-CM | POA: Diagnosis not present

## 2019-07-07 DIAGNOSIS — E7849 Other hyperlipidemia: Secondary | ICD-10-CM | POA: Diagnosis not present

## 2019-07-07 DIAGNOSIS — M069 Rheumatoid arthritis, unspecified: Secondary | ICD-10-CM | POA: Diagnosis not present

## 2019-07-07 DIAGNOSIS — I10 Essential (primary) hypertension: Secondary | ICD-10-CM | POA: Diagnosis not present

## 2019-07-07 DIAGNOSIS — S46812A Strain of other muscles, fascia and tendons at shoulder and upper arm level, left arm, initial encounter: Secondary | ICD-10-CM | POA: Diagnosis not present

## 2019-07-07 DIAGNOSIS — E038 Other specified hypothyroidism: Secondary | ICD-10-CM | POA: Diagnosis not present

## 2019-09-22 DIAGNOSIS — R591 Generalized enlarged lymph nodes: Secondary | ICD-10-CM | POA: Diagnosis not present

## 2019-09-22 DIAGNOSIS — Z6828 Body mass index (BMI) 28.0-28.9, adult: Secondary | ICD-10-CM | POA: Diagnosis not present

## 2019-09-24 DIAGNOSIS — R599 Enlarged lymph nodes, unspecified: Secondary | ICD-10-CM | POA: Diagnosis not present

## 2019-10-06 DIAGNOSIS — R5383 Other fatigue: Secondary | ICD-10-CM | POA: Diagnosis not present

## 2019-10-06 DIAGNOSIS — Z0000 Encounter for general adult medical examination without abnormal findings: Secondary | ICD-10-CM | POA: Diagnosis not present

## 2019-10-06 DIAGNOSIS — I10 Essential (primary) hypertension: Secondary | ICD-10-CM | POA: Diagnosis not present

## 2019-10-06 DIAGNOSIS — Z1331 Encounter for screening for depression: Secondary | ICD-10-CM | POA: Diagnosis not present

## 2019-10-06 DIAGNOSIS — K449 Diaphragmatic hernia without obstruction or gangrene: Secondary | ICD-10-CM | POA: Diagnosis not present

## 2019-10-06 DIAGNOSIS — Z6828 Body mass index (BMI) 28.0-28.9, adult: Secondary | ICD-10-CM | POA: Diagnosis not present

## 2019-10-06 DIAGNOSIS — I700 Atherosclerosis of aorta: Secondary | ICD-10-CM | POA: Diagnosis not present

## 2019-10-06 DIAGNOSIS — R591 Generalized enlarged lymph nodes: Secondary | ICD-10-CM | POA: Diagnosis not present

## 2019-10-20 ENCOUNTER — Other Ambulatory Visit: Payer: Self-pay

## 2019-10-20 ENCOUNTER — Encounter (INDEPENDENT_AMBULATORY_CARE_PROVIDER_SITE_OTHER): Payer: Self-pay | Admitting: Ophthalmology

## 2019-10-20 ENCOUNTER — Ambulatory Visit (INDEPENDENT_AMBULATORY_CARE_PROVIDER_SITE_OTHER): Payer: Medicare PPO | Admitting: Ophthalmology

## 2019-10-20 DIAGNOSIS — H35343 Macular cyst, hole, or pseudohole, bilateral: Secondary | ICD-10-CM | POA: Insufficient documentation

## 2019-10-20 DIAGNOSIS — H353131 Nonexudative age-related macular degeneration, bilateral, early dry stage: Secondary | ICD-10-CM | POA: Diagnosis not present

## 2019-10-20 DIAGNOSIS — H01006 Unspecified blepharitis left eye, unspecified eyelid: Secondary | ICD-10-CM

## 2019-10-20 DIAGNOSIS — D3132 Benign neoplasm of left choroid: Secondary | ICD-10-CM | POA: Insufficient documentation

## 2019-10-20 DIAGNOSIS — H35363 Drusen (degenerative) of macula, bilateral: Secondary | ICD-10-CM

## 2019-10-20 DIAGNOSIS — Z9889 Other specified postprocedural states: Secondary | ICD-10-CM

## 2019-10-20 NOTE — Progress Notes (Signed)
10/20/2019     CHIEF COMPLAINT Patient presents for Retina Follow Up   HISTORY OF PRESENT ILLNESS: Kayla Bruce is a 79 y.o. female who presents to the clinic today for:   HPI    Retina Follow Up    Patient presents with  Dry AMD.  In both eyes.  Severity is moderate.  Duration of 1 year.  Since onset it is stable.  I, the attending physician,  performed the HPI with the patient and updated documentation appropriately.          Comments    1 Year AMD f\u OU. FP.  Pt states vision is stable. Pt sees occasional floaters.         Last edited by Tilda Franco on 10/20/2019  2:52 PM. (History)      Referring physician: Neale Burly, MD [place],  [place] [id number]  HISTORICAL INFORMATION:   Selected notes from the MEDICAL RECORD NUMBER       CURRENT MEDICATIONS: No current outpatient medications on file. (Ophthalmic Drugs)   No current facility-administered medications for this visit. (Ophthalmic Drugs)   Current Outpatient Medications (Other)  Medication Sig  . ALPRAZolam (XANAX) 0.5 MG tablet Take 0.5 mg by mouth 2 (two) times daily.  Marland Kitchen atenolol (TENORMIN) 50 MG tablet Take 50 mg by mouth daily.  Marland Kitchen atorvastatin (LIPITOR) 10 MG tablet Take 10 mg by mouth daily.  . colchicine 0.6 MG tablet Take 1 tablet (0.6 mg total) by mouth daily.  Marland Kitchen doxycycline (MONODOX) 100 MG capsule   . fluticasone (FLONASE) 50 MCG/ACT nasal spray USE ONE SPRAY IN EACH NOSTRIL ONCE DAILY.  SHAKE GENTLY BEFORE USING.  . folic acid (FOLVITE) 1 MG tablet Take 1 mg by mouth daily.  Marland Kitchen levothyroxine (SYNTHROID, LEVOTHROID) 25 MCG tablet Take 25 mcg by mouth daily.  . methotrexate 2.5 MG tablet Take 2.5 mg by mouth 3 (three) times a week.  . mometasone (NASONEX) 50 MCG/ACT nasal spray   . omeprazole (PRILOSEC) 20 MG capsule Take 20 mg by mouth daily.  . ondansetron (ZOFRAN) 4 MG tablet Take 1 tablet (4 mg total) by mouth every 8 (eight) hours as needed for nausea or vomiting.   . pantoprazole (PROTONIX) 40 MG tablet   . sertraline (ZOLOFT) 50 MG tablet Take 50 mg by mouth daily.  Marland Kitchen telmisartan (MICARDIS) 80 MG tablet   . telmisartan-hydrochlorothiazide (MICARDIS HCT) 80-12.5 MG tablet Take 1 tablet by mouth daily.  . triazolam (HALCION) 0.25 MG tablet Take 0.25 mg by mouth at bedtime as needed for sleep.   No current facility-administered medications for this visit. (Other)      REVIEW OF SYSTEMS:    ALLERGIES Allergies  Allergen Reactions  . Ceclor [Cefaclor] Rash and Other (See Comments)    headache  . Ciprofloxacin Hcl Swelling and Rash    Arm was swelling and rash all over  . Codeine Nausea Only and Other (See Comments)    headache   . Levaquin [Levofloxacin In D5w] Swelling and Rash    Arm swelled   . Penicillins Rash  . Sulfa Antibiotics Rash    PAST MEDICAL HISTORY Past Medical History:  Diagnosis Date  . Arthritis   . High cholesterol   . Hypertension    Past Surgical History:  Procedure Laterality Date  . CATARACT EXTRACTION     left 2008  . CATARACT EXTRACTION Right    2001  . [place]  .  KNEE ARTHROSCOPY     rt knee 2008  . VAGINAL HYSTERECTOMY     1979  . VITRECTOMY     left eye 2009, right eye 2012    FAMILY HISTORY Family History  Problem Relation Age of Onset  . Diabetes Mother   . Macular degeneration Mother   . Hypertension Mother   . Diabetes Sister   . Hypertension Sister   . Hypertension Brother     SOCIAL HISTORY Social History   Tobacco Use  . Smoking status: Never Smoker  . Smokeless tobacco: Never Used  Substance Use Topics  . Alcohol use: No    Comment: occasasional social   . Drug use: Not on file         OPHTHALMIC EXAM: Base Eye Exam    Visual Acuity (Snellen - Linear)      Right Left   Dist cc 20/40 -2 20/30   Dist ph cc 20/40 +    Correction: Glasses       Tonometry (Tonopen, 3:03 PM)      Right Left   Pressure 15 14       Pupils      Pupils  Dark Light Shape React APD   Right PERRL 4 3 Irregular Brisk None   Left PERRL 4 3 Round Brisk None       Visual Fields      Left Right    Full Full       Neuro/Psych    Oriented x3: Yes   Mood/Affect: Normal       Dilation    Both eyes: 1.0% Mydriacyl, 2.5% Phenylephrine @ 3:03 PM        Slit Lamp and Fundus Exam    Slit Lamp Exam      Right Left   Lids/Lashes Normal Normal   Conjunctiva/Sclera White and quiet White and quiet   Cornea Clear Clear   Anterior Chamber Deep and quiet Deep and quiet   Iris Round and reactive Round and reactive   Lens Posterior chamber intraocular lens Posterior chamber intraocular lens   Anterior Vitreous Normal Normal       Fundus Exam      Right Left   Posterior Vitreous Normal Normal   Disc Normal Normal   C/D Ratio 0.45 0.45   Macula Normal Choroidal nevus, small, 1.2 disc diameters in size along the superotemporal arcade, stable over time   Vessels Normal Normal   Periphery Normal Normal          IMAGING AND PROCEDURES  Imaging and Procedures for 10/20/19  Color Fundus Photography Optos - OU - Both Eyes       Right Eye Progression has been stable. Disc findings include normal observations. Macula : normal observations. Periphery : normal observations.   Left Eye Progression has been stable. Disc findings include normal observations. Macula : normal observations. Vessels : normal observations.   Notes Choroidal nevus OS, small along the superotemporal arcade 1.2 disc diameter, flat, no lipofuscin, no fluid, no halos no atrophy                ASSESSMENT/PLAN:  Benign neoplasm of left choroid The nature of choroidal nevus was discussed with the patient and an informational form was offered.  Photo documentation was discussed with the patient.  Periodic follow-up may be needed for a lifetime. The patient's questions were answered. At minimum, annual exams will be needed if no signs of early growth.  No change  over the  last 5 years observe      ICD-10-CM   1. Early stage nonexudative age-related macular degeneration of both eyes  H35.3131 Color Fundus Photography Optos - OU - Both Eyes  2. Benign neoplasm of left choroid  D31.32 Color Fundus Photography Optos - OU - Both Eyes  3. Degenerative retinal drusen of both eyes  H35.363 Color Fundus Photography Optos - OU - Both Eyes  4. Macular hole of both eyes  H35.343   5. Blepharitis of eyelid of left eye, unspecified eyelid, unspecified type  H01.006   6. History of vitrectomy  Z98.890     1.  No interval change OS, will observe  2.  3.  Ophthalmic Meds Ordered this visit:  No orders of the defined types were placed in this encounter.      Return in about 1 year (around 10/19/2020) for DILATE OU, COLOR FP.  There are no Patient Instructions on file for this visit.   Explained the diagnoses, plan, and follow up with the patient and they expressed understanding.  Patient expressed understanding of the importance of proper follow up care.   Clent Demark Aala Ransom M.D. Diseases & Surgery of the Retina and Vitreous Retina & Diabetic [place] 10/20/19     Abbreviations: M myopia (nearsighted); A astigmatism; H hyperopia (farsighted); P presbyopia; Mrx spectacle prescription;  CTL contact lenses; OD right eye; OS left eye; OU both eyes  XT exotropia; ET esotropia; PEK punctate epithelial keratitis; PEE punctate epithelial erosions; DES dry eye syndrome; MGD meibomian gland dysfunction; ATs artificial tears; PFAT's preservative free artificial tears; [place] nuclear sclerotic cataract; PSC posterior subcapsular cataract; ERM epi-retinal membrane; PVD posterior vitreous detachment; RD retinal detachment; DM diabetes mellitus; DR diabetic retinopathy; NPDR non-proliferative diabetic retinopathy; PDR proliferative diabetic retinopathy; CSME clinically significant macular edema; DME diabetic macular edema; dbh dot blot hemorrhages; CWS cotton wool spot;  POAG primary open angle glaucoma; C/D cup-to-disc ratio; HVF humphrey visual field; GVF goldmann visual field; OCT optical coherence tomography; IOP intraocular pressure; BRVO Branch retinal vein occlusion; CRVO central retinal vein occlusion; CRAO central retinal artery occlusion; BRAO branch retinal artery occlusion; RT retinal tear; SB scleral buckle; PPV pars plana vitrectomy; VH Vitreous hemorrhage; PRP panretinal laser photocoagulation; IVK intravitreal kenalog; VMT vitreomacular traction; MH Macular hole;  NVD neovascularization of the disc; NVE neovascularization elsewhere; AREDS age related eye disease study; ARMD age related macular degeneration; POAG primary open angle glaucoma; EBMD epithelial/anterior basement membrane dystrophy; ACIOL anterior chamber intraocular lens; IOL intraocular lens; PCIOL posterior chamber intraocular lens; Phaco/IOL phacoemulsification with intraocular lens placement; [place] photorefractive keratectomy; LASIK laser assisted in situ keratomileusis; HTN hypertension; DM diabetes mellitus; COPD chronic obstructive pulmonary disease

## 2019-10-20 NOTE — Assessment & Plan Note (Signed)
The nature of choroidal nevus was discussed with the patient and an informational form was offered.  Photo documentation was discussed with the patient.  Periodic follow-up may be needed for a lifetime. The patient's questions were answered. At minimum, annual exams will be needed if no signs of early growth.  No change over the last 5 years observe

## 2019-10-27 DIAGNOSIS — R591 Generalized enlarged lymph nodes: Secondary | ICD-10-CM | POA: Diagnosis not present

## 2019-10-27 DIAGNOSIS — F411 Generalized anxiety disorder: Secondary | ICD-10-CM | POA: Diagnosis not present

## 2019-10-27 DIAGNOSIS — Z6828 Body mass index (BMI) 28.0-28.9, adult: Secondary | ICD-10-CM | POA: Diagnosis not present

## 2019-10-27 DIAGNOSIS — Z0000 Encounter for general adult medical examination without abnormal findings: Secondary | ICD-10-CM | POA: Diagnosis not present

## 2019-10-27 DIAGNOSIS — I10 Essential (primary) hypertension: Secondary | ICD-10-CM | POA: Diagnosis not present

## 2019-10-27 DIAGNOSIS — K219 Gastro-esophageal reflux disease without esophagitis: Secondary | ICD-10-CM | POA: Diagnosis not present

## 2019-10-30 DIAGNOSIS — R591 Generalized enlarged lymph nodes: Secondary | ICD-10-CM | POA: Diagnosis not present

## 2019-11-06 DIAGNOSIS — K449 Diaphragmatic hernia without obstruction or gangrene: Secondary | ICD-10-CM | POA: Diagnosis not present

## 2019-11-06 DIAGNOSIS — I700 Atherosclerosis of aorta: Secondary | ICD-10-CM | POA: Diagnosis not present

## 2019-11-06 DIAGNOSIS — R599 Enlarged lymph nodes, unspecified: Secondary | ICD-10-CM | POA: Diagnosis not present

## 2019-11-06 DIAGNOSIS — I358 Other nonrheumatic aortic valve disorders: Secondary | ICD-10-CM | POA: Diagnosis not present

## 2019-11-06 DIAGNOSIS — M11252 Other chondrocalcinosis, left hip: Secondary | ICD-10-CM | POA: Diagnosis not present

## 2019-11-06 DIAGNOSIS — M47816 Spondylosis without myelopathy or radiculopathy, lumbar region: Secondary | ICD-10-CM | POA: Diagnosis not present

## 2019-11-06 DIAGNOSIS — R591 Generalized enlarged lymph nodes: Secondary | ICD-10-CM | POA: Diagnosis not present

## 2019-11-06 DIAGNOSIS — M4316 Spondylolisthesis, lumbar region: Secondary | ICD-10-CM | POA: Diagnosis not present

## 2019-11-06 DIAGNOSIS — M5136 Other intervertebral disc degeneration, lumbar region: Secondary | ICD-10-CM | POA: Diagnosis not present

## 2019-11-06 DIAGNOSIS — M11251 Other chondrocalcinosis, right hip: Secondary | ICD-10-CM | POA: Diagnosis not present

## 2019-11-13 DIAGNOSIS — M069 Rheumatoid arthritis, unspecified: Secondary | ICD-10-CM | POA: Diagnosis not present

## 2019-11-13 DIAGNOSIS — R768 Other specified abnormal immunological findings in serum: Secondary | ICD-10-CM | POA: Diagnosis not present

## 2019-11-13 DIAGNOSIS — B2790 Infectious mononucleosis, unspecified without complication: Secondary | ICD-10-CM | POA: Diagnosis not present

## 2019-11-13 DIAGNOSIS — R591 Generalized enlarged lymph nodes: Secondary | ICD-10-CM | POA: Diagnosis not present

## 2019-11-21 DIAGNOSIS — R590 Localized enlarged lymph nodes: Secondary | ICD-10-CM | POA: Diagnosis not present

## 2019-11-27 DIAGNOSIS — R591 Generalized enlarged lymph nodes: Secondary | ICD-10-CM | POA: Diagnosis not present

## 2019-11-27 DIAGNOSIS — B2790 Infectious mononucleosis, unspecified without complication: Secondary | ICD-10-CM | POA: Diagnosis not present

## 2019-11-27 DIAGNOSIS — R768 Other specified abnormal immunological findings in serum: Secondary | ICD-10-CM | POA: Diagnosis not present

## 2020-01-05 DIAGNOSIS — R591 Generalized enlarged lymph nodes: Secondary | ICD-10-CM | POA: Diagnosis not present

## 2020-01-05 DIAGNOSIS — I10 Essential (primary) hypertension: Secondary | ICD-10-CM | POA: Diagnosis not present

## 2020-01-05 DIAGNOSIS — Z6828 Body mass index (BMI) 28.0-28.9, adult: Secondary | ICD-10-CM | POA: Diagnosis not present

## 2020-01-05 DIAGNOSIS — K219 Gastro-esophageal reflux disease without esophagitis: Secondary | ICD-10-CM | POA: Diagnosis not present

## 2020-01-05 DIAGNOSIS — F411 Generalized anxiety disorder: Secondary | ICD-10-CM | POA: Diagnosis not present

## 2020-01-27 DIAGNOSIS — B2790 Infectious mononucleosis, unspecified without complication: Secondary | ICD-10-CM | POA: Diagnosis not present

## 2020-01-27 DIAGNOSIS — M069 Rheumatoid arthritis, unspecified: Secondary | ICD-10-CM | POA: Diagnosis not present

## 2020-01-27 DIAGNOSIS — R768 Other specified abnormal immunological findings in serum: Secondary | ICD-10-CM | POA: Diagnosis not present

## 2020-01-27 DIAGNOSIS — R591 Generalized enlarged lymph nodes: Secondary | ICD-10-CM | POA: Diagnosis not present

## 2020-01-27 DIAGNOSIS — Z7189 Other specified counseling: Secondary | ICD-10-CM | POA: Diagnosis not present

## 2020-02-25 DIAGNOSIS — N6489 Other specified disorders of breast: Secondary | ICD-10-CM | POA: Diagnosis not present

## 2020-02-25 DIAGNOSIS — R590 Localized enlarged lymph nodes: Secondary | ICD-10-CM | POA: Diagnosis not present

## 2020-02-25 DIAGNOSIS — R922 Inconclusive mammogram: Secondary | ICD-10-CM | POA: Diagnosis not present

## 2020-03-05 DIAGNOSIS — Z23 Encounter for immunization: Secondary | ICD-10-CM | POA: Diagnosis not present

## 2020-03-05 DIAGNOSIS — Z7189 Other specified counseling: Secondary | ICD-10-CM | POA: Diagnosis not present

## 2020-03-05 DIAGNOSIS — R599 Enlarged lymph nodes, unspecified: Secondary | ICD-10-CM | POA: Diagnosis not present

## 2020-03-05 DIAGNOSIS — Z9229 Personal history of other drug therapy: Secondary | ICD-10-CM | POA: Diagnosis not present

## 2020-03-05 DIAGNOSIS — B2790 Infectious mononucleosis, unspecified without complication: Secondary | ICD-10-CM | POA: Diagnosis not present

## 2020-04-05 DIAGNOSIS — I10 Essential (primary) hypertension: Secondary | ICD-10-CM | POA: Diagnosis not present

## 2020-04-05 DIAGNOSIS — K219 Gastro-esophageal reflux disease without esophagitis: Secondary | ICD-10-CM | POA: Diagnosis not present

## 2020-04-05 DIAGNOSIS — R591 Generalized enlarged lymph nodes: Secondary | ICD-10-CM | POA: Diagnosis not present

## 2020-04-05 DIAGNOSIS — Z6829 Body mass index (BMI) 29.0-29.9, adult: Secondary | ICD-10-CM | POA: Diagnosis not present

## 2020-04-05 DIAGNOSIS — F411 Generalized anxiety disorder: Secondary | ICD-10-CM | POA: Diagnosis not present

## 2020-04-25 IMAGING — CT CT L SPINE W/ CM
1 of 7 series · 5 of 14 positions shown, 7 images · non-contrast
Comparison: MRI lumbar spine 10/13/2015.

CLINICAL DATA: Low back pain.  Leg weakness.  Spinal stenosis.
TECHNIQUE: Contiguous axial images were obtained through the Lumbar spine after
the intrathecal infusion of infusion. Coronal and sagittal
reconstructions were obtained of the axial image sets.

[Series 2: l spine soft · axial · 0.34mm/px · z∈[-291,-150]mm · 5 of 71 slices shown, 7 images]
[im 12/71  soft-tissue]
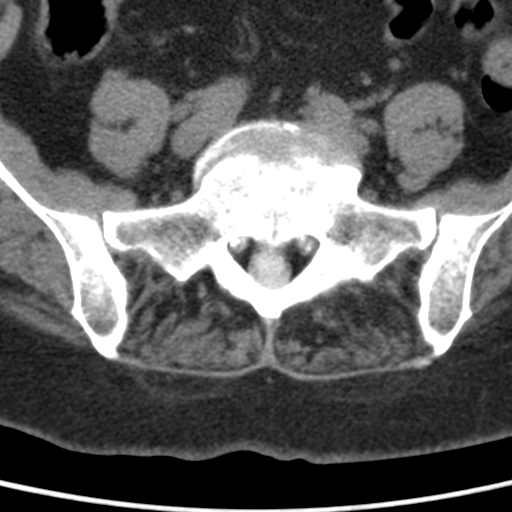
[im 12/71  bone]
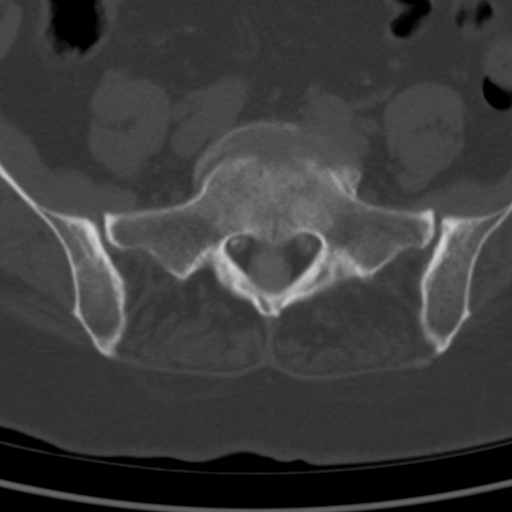
[im 24/71  bone]
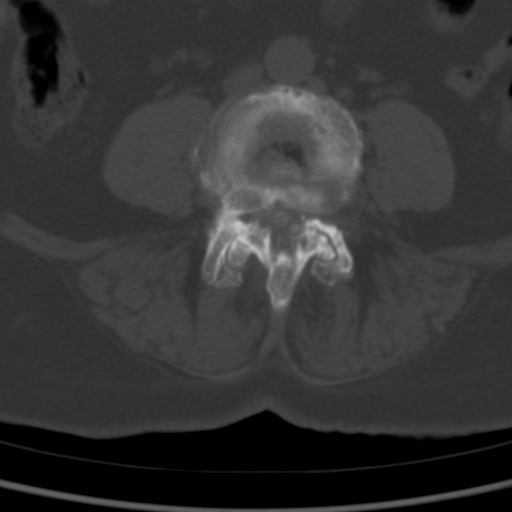
[im 36/71  bone]
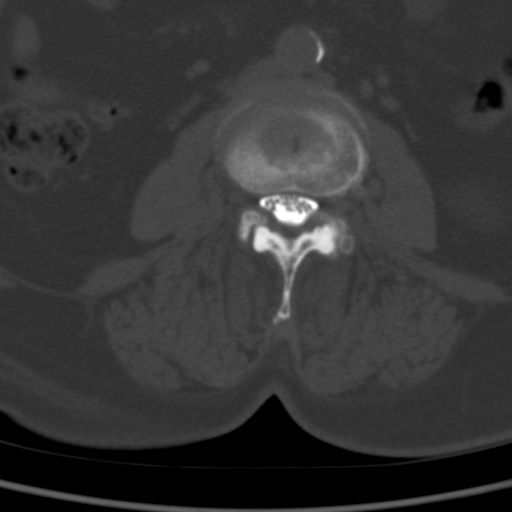
[im 47/71  bone]
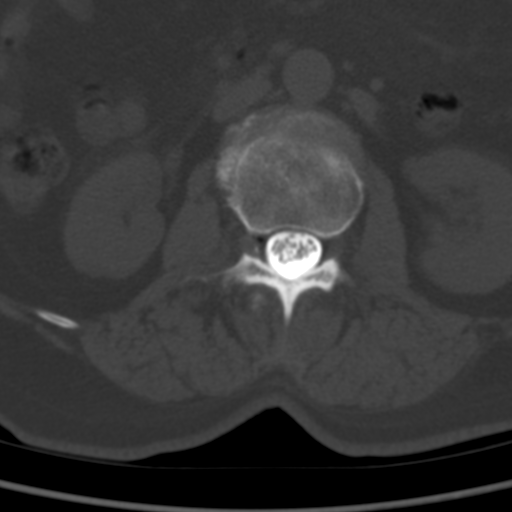
[im 59/71  soft-tissue]
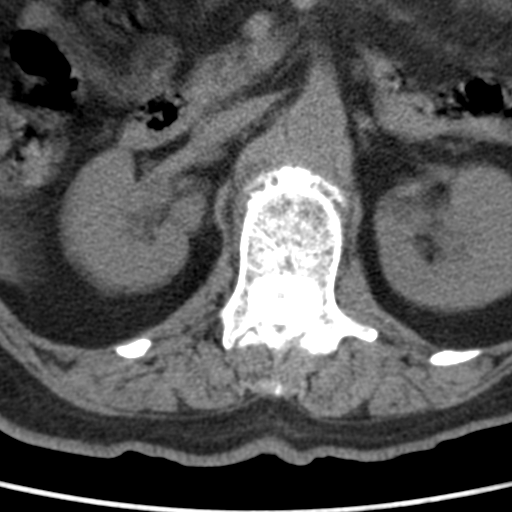
[im 59/71  bone]
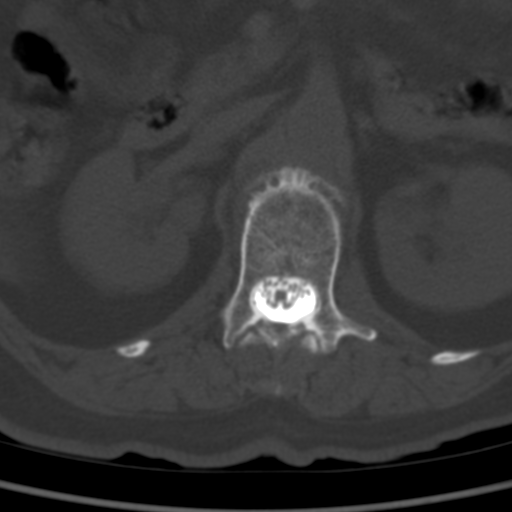

[5 of 14 positions shown; findings below may reference images not displayed]

EXAM:
LUMBAR MYELOGRAM

FLUOROSCOPY TIME:  42 seconds corresponding to a Dose Area Product
of 258.27 Gy*m2

PROCEDURE:
After thorough discussion of risks and benefits of the procedure
including bleeding, infection, injury to nerves, blood vessels,
adjacent structures as well as headache and CSF leak, written and
oral informed consent was obtained. Consent was obtained by Dr. Auad
Driskill. Time out form was completed.

Patient was positioned prone on the fluoroscopy table. Local
anesthesia was provided with 1% lidocaine without epinephrine after
prepped and draped in the usual sterile fashion. Puncture was
performed at L3 using a 3 1/2 inch 22-gauge spinal needle via
midline approach. Using a single pass through the dura, the needle
was placed within the thecal sac, with return of clear CSF. 15 mL of
Isovue 9-[id number] was injected into the thecal sac, with normal
opacification of the nerve roots and cauda equina consistent with
free flow within the subarachnoid space.

I personally performed the lumbar puncture and administered the
intrathecal contrast. I also personally supervised acquisition of
the myelogram images.
FINDINGS: LUMBAR MYELOGRAM FINDINGS:

Good opacification lumbar subarachnoid space. Severe stenosis at
L4-5, near complete block. BILATERAL L5 nerve root effacement, LEFT
slightly greater than RIGHT. Moderate to severe stenosis at L2-3,
mild to moderate stenosis at L1-2 and L3-4. Difficult to evaluate
the L5-S1 level due to paucity of contrast.

With the patient prone for myelography, there is 8 mm
anterolisthesis at L4-5. With patient standing, anterolisthesis
increases to 9 mm in neutral and flexion. In standing extension,
anterolisthesis at L4-5 decreases to 7 mm.

CT LUMBAR MYELOGRAM FINDINGS:

Segmentation: Normal.

Alignment: 8 mm anterolisthesis L4-5. Trace retrolisthesis at L2-3
and L1-2 is compensatory.

Vertebrae: No worrisome osseous lesion.

Conus medullaris: Normal in size and location.

Paraspinal tissues: No evidence for hydronephrosis or paravertebral
mass. Aortic atherosclerosis.

Disc levels:

L1-L2: Central protrusion with osseous spurring. Trace
retrolisthesis. Mild facet arthropathy. Mild to moderate stenosis.
BILATERAL L2 neural impingement.

L2-L3: Central protrusion with osseous spurring. Trace
retrolisthesis. Posterior element hypertrophy. Moderate to severe
stenosis. BILATERAL L2 and L3 neural impingement.

L3-L4: Central protrusion with osseous spurring. Posterior element
hypertrophy. Moderate to severe stenosis. BILATERAL L3 and L4 neural
impingement.

L4-L5: 8 mm anterolisthesis. Advanced posterior element hypertrophy.
Uncovering of the disc with central protrusion. Severe stenosis.
BILATERAL L4 and L5 neural impingement.

L5-S1: Disc space narrowing. Central protrusion with osseous
spurring. Facet arthropathy. BILATERAL L5 and S1 neural impingement.
IMPRESSION: LUMBAR MYELOGRAM IMPRESSION:

Severe stenosis at L4-5 is the dominant finding. Near complete
block. Mild dynamic instability, up to 9 mm with patient standing in
flexion.

Moderate to severe stenosis L2-3, mild-to-moderate stenosis at L1-2
and L3-4, without dynamic instability.

CT LUMBAR MYELOGRAM IMPRESSION:

The dominant finding is severe, near critical, stenosis at L4-5
related 8 mm slip, advanced posterior element hypertrophy,
uncovering of the disc with central protrusion. BILATERAL L4 and L5
neural impingement.

Moderate to severe stenosis on CT is greater at L3-4, central
protrusion with osseous spurring and posterior element hypertrophy
resulting in BILATERAL L3 and L4 neural impingement.

Similar less severe findings at L1-2 and L2-3 as described above.

Aortic Atherosclerosis ([id number]).

## 2020-05-26 DIAGNOSIS — R590 Localized enlarged lymph nodes: Secondary | ICD-10-CM | POA: Diagnosis not present

## 2020-06-02 DIAGNOSIS — R590 Localized enlarged lymph nodes: Secondary | ICD-10-CM | POA: Diagnosis not present

## 2020-07-12 DIAGNOSIS — K219 Gastro-esophageal reflux disease without esophagitis: Secondary | ICD-10-CM | POA: Diagnosis not present

## 2020-07-12 DIAGNOSIS — I10 Essential (primary) hypertension: Secondary | ICD-10-CM | POA: Diagnosis not present

## 2020-07-12 DIAGNOSIS — Z6829 Body mass index (BMI) 29.0-29.9, adult: Secondary | ICD-10-CM | POA: Diagnosis not present

## 2020-07-12 DIAGNOSIS — F411 Generalized anxiety disorder: Secondary | ICD-10-CM | POA: Diagnosis not present

## 2020-10-11 DIAGNOSIS — Z6829 Body mass index (BMI) 29.0-29.9, adult: Secondary | ICD-10-CM | POA: Diagnosis not present

## 2020-10-11 DIAGNOSIS — K219 Gastro-esophageal reflux disease without esophagitis: Secondary | ICD-10-CM | POA: Diagnosis not present

## 2020-10-11 DIAGNOSIS — Z0000 Encounter for general adult medical examination without abnormal findings: Secondary | ICD-10-CM | POA: Diagnosis not present

## 2020-10-11 DIAGNOSIS — I10 Essential (primary) hypertension: Secondary | ICD-10-CM | POA: Diagnosis not present

## 2020-10-11 DIAGNOSIS — F411 Generalized anxiety disorder: Secondary | ICD-10-CM | POA: Diagnosis not present

## 2020-10-15 DIAGNOSIS — Z0000 Encounter for general adult medical examination without abnormal findings: Secondary | ICD-10-CM | POA: Diagnosis not present

## 2020-10-15 DIAGNOSIS — K219 Gastro-esophageal reflux disease without esophagitis: Secondary | ICD-10-CM | POA: Diagnosis not present

## 2020-10-15 DIAGNOSIS — F411 Generalized anxiety disorder: Secondary | ICD-10-CM | POA: Diagnosis not present

## 2020-10-15 DIAGNOSIS — I10 Essential (primary) hypertension: Secondary | ICD-10-CM | POA: Diagnosis not present

## 2020-10-18 ENCOUNTER — Encounter (INDEPENDENT_AMBULATORY_CARE_PROVIDER_SITE_OTHER): Payer: Medicare PPO | Admitting: Ophthalmology

## 2020-11-11 ENCOUNTER — Encounter (INDEPENDENT_AMBULATORY_CARE_PROVIDER_SITE_OTHER): Payer: Self-pay | Admitting: Ophthalmology

## 2020-11-11 ENCOUNTER — Other Ambulatory Visit: Payer: Self-pay

## 2020-11-11 ENCOUNTER — Ambulatory Visit (INDEPENDENT_AMBULATORY_CARE_PROVIDER_SITE_OTHER): Payer: Medicare PPO | Admitting: Ophthalmology

## 2020-11-11 DIAGNOSIS — D3132 Benign neoplasm of left choroid: Secondary | ICD-10-CM

## 2020-11-11 DIAGNOSIS — H35343 Macular cyst, hole, or pseudohole, bilateral: Secondary | ICD-10-CM

## 2020-11-11 DIAGNOSIS — H353131 Nonexudative age-related macular degeneration, bilateral, early dry stage: Secondary | ICD-10-CM | POA: Diagnosis not present

## 2020-11-11 NOTE — Progress Notes (Signed)
11/11/2020     CHIEF COMPLAINT Patient presents for Retina Follow Up (1 Year AMD F/U OU//Pt denies noticeable changes to [place] OU since last visit. Pt denies ocular pain, flashes of light, or floaters OU. //)   HISTORY OF PRESENT ILLNESS: Kayla Bruce is a 80 y.o. female who presents to the clinic today for:   HPI     Retina Follow Up           Diagnosis: Dry AMD   Laterality: both eyes   Onset: 1 year ago   Severity: mild   Duration: 1 year   Course: stable   Comments: 1 Year AMD F/U OU  Pt denies noticeable changes to [place] OU since last visit. Pt denies ocular pain, flashes of light, or floaters OU.          Last edited by Rockie Neighbours, [place] on 11/11/2020  1:34 PM.      Referring physician: Neale Burly, MD [place],  [place] [address]  HISTORICAL INFORMATION:   Selected notes from the MEDICAL RECORD NUMBER       CURRENT MEDICATIONS: No current outpatient medications on file. (Ophthalmic Drugs)   No current facility-administered medications for this visit. (Ophthalmic Drugs)   Current Outpatient Medications (Other)  Medication Sig   ALPRAZolam (XANAX) 0.5 MG tablet Take 0.5 mg by mouth 2 (two) times daily.   atenolol (TENORMIN) 50 MG tablet Take 50 mg by mouth daily.   atorvastatin (LIPITOR) 10 MG tablet Take 10 mg by mouth daily.   colchicine 0.6 MG tablet Take 1 tablet (0.6 mg total) by mouth daily.   doxycycline (MONODOX) 100 MG capsule    fluticasone (FLONASE) 50 MCG/ACT nasal spray USE ONE SPRAY IN EACH NOSTRIL ONCE DAILY.  SHAKE GENTLY BEFORE USING.   folic acid (FOLVITE) 1 MG tablet Take 1 mg by mouth daily.   levothyroxine (SYNTHROID, LEVOTHROID) 25 MCG tablet Take 25 mcg by mouth daily.   methotrexate 2.5 MG tablet Take 2.5 mg by mouth 3 (three) times a week.   mometasone (NASONEX) 50 MCG/ACT nasal spray    omeprazole (PRILOSEC) 20 MG capsule Take 20 mg by mouth daily.   ondansetron (ZOFRAN) 4 MG tablet Take 1 tablet (4 mg total) by  mouth every 8 (eight) hours as needed for nausea or vomiting.   pantoprazole (PROTONIX) 40 MG tablet    sertraline (ZOLOFT) 50 MG tablet Take 50 mg by mouth daily.   telmisartan (MICARDIS) 80 MG tablet    telmisartan-hydrochlorothiazide (MICARDIS HCT) 80-12.5 MG tablet Take 1 tablet by mouth daily.   triazolam (HALCION) 0.25 MG tablet Take 0.25 mg by mouth at bedtime as needed for sleep.   No current facility-administered medications for this visit. (Other)      REVIEW OF SYSTEMS:    ALLERGIES Allergies  Allergen Reactions   Ceclor [Cefaclor] Rash and Other (See Comments)    headache   Ciprofloxacin Hcl Swelling and Rash    Arm was swelling and rash all over   Codeine Nausea Only and Other (See Comments)    headache    Levaquin [Levofloxacin In D5w] Swelling and Rash    Arm swelled    Penicillins Rash   Sulfa Antibiotics Rash    PAST MEDICAL HISTORY Past Medical History:  Diagnosis Date   Arthritis    High cholesterol    Hypertension    Past Surgical History:  Procedure Laterality Date   CATARACT EXTRACTION     left 2008  CATARACT EXTRACTION Right    2001   GALLBLADDER SURGERY     1997   KNEE ARTHROSCOPY     rt knee 2008   VAGINAL HYSTERECTOMY     1979   VITRECTOMY     left eye 2009, right eye 2012    FAMILY HISTORY Family History  Problem Relation Age of Onset   Diabetes Mother    Macular degeneration Mother    Hypertension Mother    Diabetes Sister    Hypertension Sister    Hypertension Brother     SOCIAL HISTORY Social History   Tobacco Use   Smoking status: Never   Smokeless tobacco: Never  Substance Use Topics   Alcohol use: No    Comment: occasasional social          OPHTHALMIC EXAM:  Base Eye Exam     Visual Acuity (ETDRS)       Right Left   Dist cc 20/30 +1 20/40 +2   Dist ph cc NI 20/30 -2    Correction: Glasses         Tonometry (Tonopen, 1:38 PM)       Right Left   Pressure 12 11         Pupils        Dark Light Shape React APD   Right 5 4 Irregular Brisk None   Left 5 4 Round Brisk None         Visual Fields (Counting fingers)       Left Right    Full Full         Extraocular Movement       Right Left    Full Full         Neuro/Psych     Oriented x3: Yes   Mood/Affect: Normal         Dilation     Both eyes: 1.0% Mydriacyl, 2.5% Phenylephrine @ 1:38 PM           Slit Lamp and Fundus Exam     External Exam       Right Left   External Normal Normal         Slit Lamp Exam       Right Left   Lids/Lashes Normal Normal   Conjunctiva/Sclera White and quiet White and quiet   Cornea Clear Clear   Anterior Chamber Deep and quiet Deep and quiet   Iris Round and reactive Round and reactive   Lens Posterior chamber intraocular lens Posterior chamber intraocular lens   Anterior Vitreous Normal Normal         Fundus Exam       Right Left   Posterior Vitreous Normal Normal   Disc Normal Normal   C/D Ratio 0.45 0.45   Macula Normal Choroidal nevus, small, 1.2 disc diameters in size along the superotemporal arcade, stable over time   Vessels Normal Normal   Periphery Normal Normal            IMAGING AND PROCEDURES  Imaging and Procedures for 11/11/20  Color Fundus Photography Optos - OU - Both Eyes       Right Eye Progression has been stable. Disc findings include normal observations. Macula : normal observations. Periphery : normal observations.   Left Eye Progression has been stable. Disc findings include normal observations. Macula : normal observations. Vessels : normal observations.   Notes Choroidal nevus OS, small along the superotemporal arcade 1.2 disc diameter, flat, no lipofuscin, no fluid, no  halos no atrophy             ASSESSMENT/PLAN:  Benign neoplasm of left choroid Small nevus no change in size left eye no high risk features  Early stage nonexudative age-related macular degeneration of both eyes Mild ARMD  with minor drusen not high risk does not require vitamin supplementation optional use per patient choice     ICD-10-CM   1. Early stage nonexudative age-related macular degeneration of both eyes  H35.3131 Color Fundus Photography Optos - OU - Both Eyes    2. Macular hole of both eyes  H35.343     3. Benign neoplasm of left choroid  D31.32       1.  OU with a history of macular hole status postrepair with excellent visual acuity recovery OU.  2.  Choroidal nevus left eye no change over the years no high risk features observe  3.  Mild ARMD, optional use of eye vitamins  Ophthalmic Meds Ordered this visit:  No orders of the defined types were placed in this encounter.      Return in about 1 year (around 11/11/2021) for DILATE OU, COLOR FP, OCT.  There are no Patient Instructions on file for this visit.   Explained the diagnoses, plan, and follow up with the patient and they expressed understanding.  Patient expressed understanding of the importance of proper follow up care.   Clent Demark Nicolaos Mitrano M.D. Diseases & Surgery of the Retina and Vitreous Retina & Diabetic [place] 11/11/20     Abbreviations: M myopia (nearsighted); A astigmatism; H hyperopia (farsighted); P presbyopia; Mrx spectacle prescription;  CTL contact lenses; OD right eye; OS left eye; OU both eyes  XT exotropia; ET esotropia; PEK punctate epithelial keratitis; PEE punctate epithelial erosions; DES dry eye syndrome; MGD meibomian gland dysfunction; ATs artificial tears; PFAT's preservative free artificial tears; [place] nuclear sclerotic cataract; PSC posterior subcapsular cataract; ERM epi-retinal membrane; PVD posterior vitreous detachment; RD retinal detachment; DM diabetes mellitus; DR diabetic retinopathy; NPDR non-proliferative diabetic retinopathy; PDR proliferative diabetic retinopathy; CSME clinically significant macular edema; DME diabetic macular edema; dbh dot blot hemorrhages; CWS cotton wool spot; POAG  primary open angle glaucoma; C/D cup-to-disc ratio; HVF humphrey visual field; GVF goldmann visual field; OCT optical coherence tomography; IOP intraocular pressure; BRVO Branch retinal vein occlusion; CRVO central retinal vein occlusion; CRAO central retinal artery occlusion; BRAO branch retinal artery occlusion; RT retinal tear; SB scleral buckle; PPV pars plana vitrectomy; VH Vitreous hemorrhage; PRP panretinal laser photocoagulation; IVK intravitreal kenalog; VMT vitreomacular traction; MH Macular hole;  NVD neovascularization of the disc; NVE neovascularization elsewhere; AREDS age related eye disease study; ARMD age related macular degeneration; POAG primary open angle glaucoma; EBMD epithelial/anterior basement membrane dystrophy; ACIOL anterior chamber intraocular lens; IOL intraocular lens; PCIOL posterior chamber intraocular lens; Phaco/IOL phacoemulsification with intraocular lens placement; [place] photorefractive keratectomy; LASIK laser assisted in situ keratomileusis; HTN hypertension; DM diabetes mellitus; COPD chronic obstructive pulmonary disease

## 2020-11-11 NOTE — Assessment & Plan Note (Signed)
Mild ARMD with minor drusen not high risk does not require vitamin supplementation optional use per patient choice

## 2020-11-11 NOTE — Assessment & Plan Note (Signed)
Small nevus no change in size left eye no high risk features

## 2020-12-23 DIAGNOSIS — Z6830 Body mass index (BMI) 30.0-30.9, adult: Secondary | ICD-10-CM | POA: Diagnosis not present

## 2020-12-23 DIAGNOSIS — I700 Atherosclerosis of aorta: Secondary | ICD-10-CM | POA: Diagnosis not present

## 2020-12-23 DIAGNOSIS — R1310 Dysphagia, unspecified: Secondary | ICD-10-CM | POA: Diagnosis not present

## 2020-12-28 ENCOUNTER — Encounter (INDEPENDENT_AMBULATORY_CARE_PROVIDER_SITE_OTHER): Payer: Self-pay | Admitting: *Deleted

## 2021-01-10 DIAGNOSIS — R1310 Dysphagia, unspecified: Secondary | ICD-10-CM | POA: Diagnosis not present

## 2021-01-10 DIAGNOSIS — K219 Gastro-esophageal reflux disease without esophagitis: Secondary | ICD-10-CM | POA: Diagnosis not present

## 2021-01-10 DIAGNOSIS — I10 Essential (primary) hypertension: Secondary | ICD-10-CM | POA: Diagnosis not present

## 2021-01-10 DIAGNOSIS — Z6829 Body mass index (BMI) 29.0-29.9, adult: Secondary | ICD-10-CM | POA: Diagnosis not present

## 2021-01-10 DIAGNOSIS — Z0000 Encounter for general adult medical examination without abnormal findings: Secondary | ICD-10-CM | POA: Diagnosis not present

## 2021-01-10 DIAGNOSIS — F411 Generalized anxiety disorder: Secondary | ICD-10-CM | POA: Diagnosis not present

## 2021-01-10 DIAGNOSIS — E038 Other specified hypothyroidism: Secondary | ICD-10-CM | POA: Diagnosis not present

## 2021-01-10 DIAGNOSIS — I700 Atherosclerosis of aorta: Secondary | ICD-10-CM | POA: Diagnosis not present

## 2021-01-10 DIAGNOSIS — E785 Hyperlipidemia, unspecified: Secondary | ICD-10-CM | POA: Diagnosis not present

## 2021-02-17 DIAGNOSIS — M1712 Unilateral primary osteoarthritis, left knee: Secondary | ICD-10-CM | POA: Diagnosis not present

## 2021-02-28 DIAGNOSIS — Z1231 Encounter for screening mammogram for malignant neoplasm of breast: Secondary | ICD-10-CM | POA: Diagnosis not present

## 2021-03-30 DIAGNOSIS — Z6829 Body mass index (BMI) 29.0-29.9, adult: Secondary | ICD-10-CM | POA: Diagnosis not present

## 2021-03-30 DIAGNOSIS — K219 Gastro-esophageal reflux disease without esophagitis: Secondary | ICD-10-CM | POA: Diagnosis not present

## 2021-03-30 DIAGNOSIS — I700 Atherosclerosis of aorta: Secondary | ICD-10-CM | POA: Diagnosis not present

## 2021-03-30 DIAGNOSIS — I10 Essential (primary) hypertension: Secondary | ICD-10-CM | POA: Diagnosis not present

## 2021-03-30 DIAGNOSIS — E785 Hyperlipidemia, unspecified: Secondary | ICD-10-CM | POA: Diagnosis not present

## 2021-03-30 DIAGNOSIS — E038 Other specified hypothyroidism: Secondary | ICD-10-CM | POA: Diagnosis not present

## 2021-03-30 DIAGNOSIS — F411 Generalized anxiety disorder: Secondary | ICD-10-CM | POA: Diagnosis not present

## 2021-05-05 ENCOUNTER — Ambulatory Visit (INDEPENDENT_AMBULATORY_CARE_PROVIDER_SITE_OTHER): Payer: Medicare PPO | Admitting: Gastroenterology

## 2021-05-12 DIAGNOSIS — M810 Age-related osteoporosis without current pathological fracture: Secondary | ICD-10-CM | POA: Diagnosis not present

## 2021-05-12 DIAGNOSIS — M8589 Other specified disorders of bone density and structure, multiple sites: Secondary | ICD-10-CM | POA: Diagnosis not present

## 2021-07-04 DIAGNOSIS — I10 Essential (primary) hypertension: Secondary | ICD-10-CM | POA: Diagnosis not present

## 2021-07-04 DIAGNOSIS — E785 Hyperlipidemia, unspecified: Secondary | ICD-10-CM | POA: Diagnosis not present

## 2021-07-04 DIAGNOSIS — I700 Atherosclerosis of aorta: Secondary | ICD-10-CM | POA: Diagnosis not present

## 2021-07-04 DIAGNOSIS — K219 Gastro-esophageal reflux disease without esophagitis: Secondary | ICD-10-CM | POA: Diagnosis not present

## 2021-07-04 DIAGNOSIS — E038 Other specified hypothyroidism: Secondary | ICD-10-CM | POA: Diagnosis not present

## 2021-07-04 DIAGNOSIS — F411 Generalized anxiety disorder: Secondary | ICD-10-CM | POA: Diagnosis not present

## 2021-07-04 DIAGNOSIS — Z6828 Body mass index (BMI) 28.0-28.9, adult: Secondary | ICD-10-CM | POA: Diagnosis not present

## 2021-07-04 DIAGNOSIS — Z0000 Encounter for general adult medical examination without abnormal findings: Secondary | ICD-10-CM | POA: Diagnosis not present

## 2021-07-13 NOTE — Progress Notes (Addendum)
Covid test on 07/22/21  Come thru main entrance at [facility].  Have a seat in the lobby on the right as you come thru the doorl.   Call [phone] nad let them know you are here for covid testing    Your procedure is scheduled on:                 07/26/21.   Report to [facility] Main  Entrance   Report to admitting at   1015am      Call this number if you have problems the morning of surgery [phone]    REMEMBER: NO  SOLID FOOD CANDY OR GUM AFTER MIDNIGHT. CLEAR LIQUIDS UNTIL    0955am       . NOTHING BY MOUTH EXCEPT CLEAR LIQUIDS UNTIL  0955am  . PLEASE FINISH ENSURE DRINK PER SURGEON ORDER  WHICH NEEDS TO BE COMPLETED AT    0955am   .      CLEAR LIQUID DIET   Foods Allowed                                                                    Coffee and tea, regular and decaf                            Fruit ices (not with fruit pulp)                                      Iced Popsicles                                    Carbonated beverages, regular and diet                                    Cranberry, grape and apple juices Sports drinks like Gatorade Lightly seasoned clear broth or consume(fat free) Sugar, honey syrup ___________________________________________________________________      BRUSH YOUR TEETH MORNING OF SURGERY AND RINSE YOUR MOUTH OUT, NO CHEWING GUM CANDY OR MINTS.     Take these medicines the morning of surgery with A SIP OF WATER:   Amloidpine, atenolol, colchicine, synthroid, protonix, zoloft    DO NOT TAKE ANY DIABETIC MEDICATIONS DAY OF YOUR SURGERY                               You may not have any metal on your body including hair pins and              piercings  Do not wear jewelry, make-up, lotions, powders or perfumes, deodorant             Do not wear nail polish on your fingernails.  Do not shave  48 hours prior to surgery.              Men may shave face and neck.   Do not bring valuables to the hospital. [facility] IS  NOT             RESPONSIBLE   FOR VALUABLES.  Contacts, dentures or bridgework may not be worn into surgery.  Leave suitcase in the car. After surgery it may be brought to your room.     Patients discharged the day of surgery will not be allowed to drive home. IF YOU ARE HAVING SURGERY AND GOING HOME THE SAME DAY, YOU MUST HAVE AN ADULT TO DRIVE YOU HOME AND BE WITH YOU FOR 24 HOURS. YOU MAY GO HOME BY TAXI OR UBER OR ORTHERWISE, BUT AN ADULT MUST ACCOMPANY YOU HOME AND STAY WITH YOU FOR 24 HOURS.  Name and phone number of your driver:  Special Instructions: N/A              Please read over the following fact sheets you were given: _____________________________________________________________________  [facility] - Preparing for Surgery Before surgery, you can play an important role.  Because skin is not sterile, your skin needs to be as free of germs as possible.  You can reduce the number of germs on your skin by washing with CHG (chlorahexidine gluconate) soap before surgery.  CHG is an antiseptic cleaner which kills germs and bonds with the skin to continue killing germs even after washing. Please DO NOT use if you have an allergy to CHG or antibacterial soaps.  If your skin becomes reddened/irritated stop using the CHG and inform your nurse when you arrive at Short Stay. Do not shave (including legs and underarms) for at least 48 hours prior to the first CHG shower.  You may shave your face/neck. Please follow these instructions carefully:  1.  Shower with CHG Soap the night before surgery and the  morning of Surgery.  2.  If you choose to wash your hair, wash your hair first as usual with your  normal  shampoo.  3.  After you shampoo, rinse your hair and body thoroughly to remove the  shampoo.                           4.  Use CHG as you would any other liquid soap.  You can apply chg directly  to the skin and wash                       Gently with a scrungie or clean washcloth.  5.   Apply the CHG Soap to your body ONLY FROM THE NECK DOWN.   Do not use on face/ open                           Wound or open sores. Avoid contact with eyes, ears mouth and genitals (private parts).                       Wash face,  Genitals (private parts) with your normal soap.             6.  Wash thoroughly, paying special attention to the area where your surgery  will be performed.  7.  Thoroughly rinse your body with warm water from the neck down.  8.  DO NOT shower/wash with your normal soap after using and rinsing off  the CHG Soap.                9.  Pat yourself dry with  a clean towel.            10.  Wear clean pajamas.            11.  Place clean sheets on your bed the night of your first shower and do not  sleep with pets. Day of Surgery : Do not apply any lotions/deodorants the morning of surgery.  Please wear clean clothes to the hospital/surgery center.  FAILURE TO FOLLOW THESE INSTRUCTIONS MAY RESULT IN THE CANCELLATION OF YOUR SURGERY PATIENT SIGNATURE_________________________________  NURSE SIGNATURE__________________________________  ________________________________________________________________________

## 2021-07-13 NOTE — Progress Notes (Addendum)
Anesthesia Review:  PCP: DR Dagoberto Ligas in [place] 06/01/21  Cardiologist : none  Chest x-ray :  EKG : 07/18/21  Echo : Stress test: Cardiac Cath :  Activity level:  Sleep Study/ CPAP : Fasting Blood Sugar :      / Checks Blood Sugar -- times a day:   Blood Thinner/ Instructions /Last Dose: ASA / Instructions/ Last Dose :   Covid test on 07/22/21  0915am  PT called at  noon to say she was at [place] office and he was running 1.5 hours behind.  PT to call when she is finished at [place] and depending on time she will have labs done today or appt will be rescheduled.  PT to call when complete at Dr Alvan Dame. PT called and preop completed on 07/18/21.   Prediabetes - does not check glucose at home  Hgba1c- 07/18/21- 6.1  Cbc done 07/18/21 routed to Dr Alvan Dame- White count- 2.6 Armando Reichert made aware.

## 2021-07-18 ENCOUNTER — Encounter (HOSPITAL_COMMUNITY)
Admission: RE | Admit: 2021-07-18 | Discharge: 2021-07-18 | Disposition: A | Discharge status: Home / Self Care | Payer: Medicare PPO | Source: Ambulatory Visit | Attending: Orthopedic Surgery | Admitting: Orthopedic Surgery

## 2021-07-18 ENCOUNTER — Other Ambulatory Visit: Payer: Self-pay

## 2021-07-18 ENCOUNTER — Encounter (HOSPITAL_COMMUNITY): Payer: Self-pay

## 2021-07-18 VITALS — BP 132/72 | HR 71 | Temp 98.6°F | Resp 16 | Ht 61.0 in | Wt 145.0 lb

## 2021-07-18 DIAGNOSIS — M1712 Unilateral primary osteoarthritis, left knee: Secondary | ICD-10-CM | POA: Diagnosis not present

## 2021-07-18 DIAGNOSIS — Z01818 Encounter for other preprocedural examination: Secondary | ICD-10-CM

## 2021-07-18 DIAGNOSIS — Z20822 Contact with and (suspected) exposure to covid-19: Secondary | ICD-10-CM | POA: Insufficient documentation

## 2021-07-18 HISTORY — DX: Prediabetes: R73.03

## 2021-07-18 HISTORY — DX: Anxiety disorder, unspecified: F41.9

## 2021-07-18 HISTORY — DX: Hypothyroidism, unspecified: E03.9

## 2021-07-18 HISTORY — DX: Nausea with vomiting, unspecified: Z98.890

## 2021-07-18 HISTORY — DX: Nausea with vomiting, unspecified: R11.2

## 2021-07-18 HISTORY — DX: Depression, unspecified: F32.A

## 2021-07-18 LAB — COMPREHENSIVE METABOLIC PANEL
ALT: 18 U/L (ref 0–44)
AST: 23 U/L (ref 15–41)
Albumin: 4.4 g/dL (ref 3.5–5.0)
Alkaline Phosphatase: 63 U/L (ref 38–126)
Anion gap: 7 (ref 5–15)
BUN: 17 mg/dL (ref 8–23)
CO2: 27 mmol/L (ref 22–32)
Calcium: 9.4 mg/dL (ref 8.9–10.3)
Chloride: 101 mmol/L (ref 98–111)
Creatinine, Ser: 0.65 mg/dL (ref 0.44–1.00)
GFR, Estimated: >60 mL/min (ref >60)
Glucose, Bld: 107 mg/dL — ABNORMAL HIGH (ref 70–99)
Potassium: 4.2 mmol/L (ref 3.5–5.1)
Sodium: 135 mmol/L (ref 135–145)
Total Bilirubin: 0.3 mg/dL (ref 0.3–1.2)
Total Protein: 7.4 g/dL (ref 6.5–8.1)

## 2021-07-18 LAB — CBC
HCT: 37.8 % (ref 36.0–46.0)
Hemoglobin: 12.2 g/dL (ref 12.0–15.0)
MCH: 28.8 pg (ref 26.0–34.0)
MCHC: 32.3 g/dL (ref 30.0–36.0)
MCV: 89.2 fL (ref 80.0–100.0)
Platelets: 213 10*3/uL (ref 150–400)
RBC: 4.24 MIL/uL (ref 3.87–5.11)
RDW: 16.8 % — ABNORMAL HIGH (ref 11.5–15.5)
WBC: 2.6 10*3/uL — ABNORMAL LOW (ref 4.0–10.5)
nRBC: 0 % (ref 0.0–0.2)

## 2021-07-18 LAB — HEMOGLOBIN A1C
Hgb A1c MFr Bld: 6.1 % — ABNORMAL HIGH (ref 4.8–5.6)
Mean Plasma Glucose: 128.37 mg/dL

## 2021-07-18 LAB — SURGICAL PCR SCREEN
MRSA, PCR: NEGATIVE
Staphylococcus aureus: NEGATIVE

## 2021-07-18 LAB — GLUCOSE, CAPILLARY: Glucose-Capillary: 95 mg/dL (ref 70–99)

## 2021-07-19 ENCOUNTER — Encounter (HOSPITAL_COMMUNITY): Payer: Self-pay | Admitting: Physician Assistant

## 2021-07-21 DIAGNOSIS — Z20822 Contact with and (suspected) exposure to covid-19: Secondary | ICD-10-CM | POA: Diagnosis not present

## 2021-07-21 DIAGNOSIS — Z01812 Encounter for preprocedural laboratory examination: Secondary | ICD-10-CM | POA: Diagnosis not present

## 2021-07-22 ENCOUNTER — Other Ambulatory Visit: Payer: Self-pay

## 2021-07-22 ENCOUNTER — Encounter (HOSPITAL_COMMUNITY)
Admission: RE | Admit: 2021-07-22 | Discharge: 2021-07-22 | Disposition: A | Discharge status: Home / Self Care | Payer: Medicare PPO | Source: Ambulatory Visit | Attending: Orthopedic Surgery | Admitting: Orthopedic Surgery

## 2021-07-22 DIAGNOSIS — Z20822 Contact with and (suspected) exposure to covid-19: Secondary | ICD-10-CM | POA: Insufficient documentation

## 2021-07-22 DIAGNOSIS — Z01812 Encounter for preprocedural laboratory examination: Secondary | ICD-10-CM | POA: Insufficient documentation

## 2021-07-22 LAB — SARS CORONAVIRUS 2 (TAT 6-24 HRS): SARS Coronavirus 2: NEGATIVE

## 2021-07-22 NOTE — Progress Notes (Signed)
Amber from [employer] of DR Lysle Rubens called and LVMM for nurse to call her.  Called office and spoke with Luetta Nutting and she stated per DR Deforest Hoyles that pt needs Hematology referral.  Informed her that PA in dept would be notified.  Notified Armando Reichert in PST.  Janett Billow Ward stated she would notify DR Alvan Dame.

## 2021-07-25 DIAGNOSIS — D708 Other neutropenia: Secondary | ICD-10-CM | POA: Diagnosis not present

## 2021-07-25 DIAGNOSIS — Z881 Allergy status to other antibiotic agents status: Secondary | ICD-10-CM | POA: Diagnosis not present

## 2021-07-25 DIAGNOSIS — Z90710 Acquired absence of both cervix and uterus: Secondary | ICD-10-CM | POA: Diagnosis not present

## 2021-07-25 DIAGNOSIS — Z96651 Presence of right artificial knee joint: Secondary | ICD-10-CM | POA: Diagnosis not present

## 2021-07-25 DIAGNOSIS — D72819 Decreased white blood cell count, unspecified: Secondary | ICD-10-CM | POA: Diagnosis not present

## 2021-07-25 DIAGNOSIS — M5136 Other intervertebral disc degeneration, lumbar region: Secondary | ICD-10-CM | POA: Diagnosis not present

## 2021-07-25 DIAGNOSIS — Z9049 Acquired absence of other specified parts of digestive tract: Secondary | ICD-10-CM | POA: Diagnosis not present

## 2021-07-25 DIAGNOSIS — I10 Essential (primary) hypertension: Secondary | ICD-10-CM | POA: Diagnosis not present

## 2021-07-25 DIAGNOSIS — Z880 Allergy status to penicillin: Secondary | ICD-10-CM | POA: Diagnosis not present

## 2021-07-25 DIAGNOSIS — E079 Disorder of thyroid, unspecified: Secondary | ICD-10-CM | POA: Diagnosis not present

## 2021-07-26 ENCOUNTER — Ambulatory Visit (HOSPITAL_COMMUNITY): Admission: RE | Admit: 2021-07-26 | Payer: Medicare PPO | Source: Ambulatory Visit | Admitting: Orthopedic Surgery

## 2021-07-26 ENCOUNTER — Encounter (HOSPITAL_COMMUNITY): Admission: RE | Payer: Self-pay | Source: Ambulatory Visit

## 2021-07-26 DIAGNOSIS — Z01812 Encounter for preprocedural laboratory examination: Secondary | ICD-10-CM

## 2021-07-26 DIAGNOSIS — Z01818 Encounter for other preprocedural examination: Secondary | ICD-10-CM

## 2021-07-26 DIAGNOSIS — M1712 Unilateral primary osteoarthritis, left knee: Secondary | ICD-10-CM

## 2021-07-26 LAB — TYPE AND SCREEN
ABO/RH(D): A POS
Antibody Screen: NEGATIVE

## 2021-07-26 SURGERY — ARTHROPLASTY, KNEE, TOTAL
Anesthesia: Spinal | Site: Knee | Laterality: Left

## 2021-08-08 DIAGNOSIS — D709 Neutropenia, unspecified: Secondary | ICD-10-CM | POA: Diagnosis not present

## 2021-08-08 DIAGNOSIS — D708 Other neutropenia: Secondary | ICD-10-CM | POA: Diagnosis not present

## 2021-08-30 DIAGNOSIS — D709 Neutropenia, unspecified: Secondary | ICD-10-CM | POA: Diagnosis not present

## 2021-08-30 DIAGNOSIS — Z0389 Encounter for observation for other suspected diseases and conditions ruled out: Secondary | ICD-10-CM | POA: Diagnosis not present

## 2021-09-09 DIAGNOSIS — D72819 Decreased white blood cell count, unspecified: Secondary | ICD-10-CM | POA: Diagnosis not present

## 2021-09-09 DIAGNOSIS — D709 Neutropenia, unspecified: Secondary | ICD-10-CM | POA: Diagnosis not present

## 2021-09-19 DIAGNOSIS — D72819 Decreased white blood cell count, unspecified: Secondary | ICD-10-CM | POA: Diagnosis not present

## 2021-09-26 DIAGNOSIS — D72819 Decreased white blood cell count, unspecified: Secondary | ICD-10-CM | POA: Diagnosis not present

## 2021-09-26 DIAGNOSIS — Z9049 Acquired absence of other specified parts of digestive tract: Secondary | ICD-10-CM | POA: Diagnosis not present

## 2021-10-03 DIAGNOSIS — E038 Other specified hypothyroidism: Secondary | ICD-10-CM | POA: Diagnosis not present

## 2021-10-03 DIAGNOSIS — Z6828 Body mass index (BMI) 28.0-28.9, adult: Secondary | ICD-10-CM | POA: Diagnosis not present

## 2021-10-03 DIAGNOSIS — K219 Gastro-esophageal reflux disease without esophagitis: Secondary | ICD-10-CM | POA: Diagnosis not present

## 2021-10-03 DIAGNOSIS — E785 Hyperlipidemia, unspecified: Secondary | ICD-10-CM | POA: Diagnosis not present

## 2021-10-03 DIAGNOSIS — I700 Atherosclerosis of aorta: Secondary | ICD-10-CM | POA: Diagnosis not present

## 2021-10-03 DIAGNOSIS — F411 Generalized anxiety disorder: Secondary | ICD-10-CM | POA: Diagnosis not present

## 2021-10-03 DIAGNOSIS — I10 Essential (primary) hypertension: Secondary | ICD-10-CM | POA: Diagnosis not present

## 2021-10-03 DIAGNOSIS — Z0000 Encounter for general adult medical examination without abnormal findings: Secondary | ICD-10-CM | POA: Diagnosis not present

## 2021-11-09 DIAGNOSIS — D709 Neutropenia, unspecified: Secondary | ICD-10-CM | POA: Diagnosis not present

## 2021-11-09 DIAGNOSIS — D72819 Decreased white blood cell count, unspecified: Secondary | ICD-10-CM | POA: Diagnosis not present

## 2021-11-17 ENCOUNTER — Ambulatory Visit (INDEPENDENT_AMBULATORY_CARE_PROVIDER_SITE_OTHER): Payer: Medicare PPO | Admitting: Ophthalmology

## 2021-11-17 ENCOUNTER — Encounter (INDEPENDENT_AMBULATORY_CARE_PROVIDER_SITE_OTHER): Payer: Self-pay | Admitting: Ophthalmology

## 2021-11-17 DIAGNOSIS — H353131 Nonexudative age-related macular degeneration, bilateral, early dry stage: Secondary | ICD-10-CM | POA: Diagnosis not present

## 2021-11-17 DIAGNOSIS — H35343 Macular cyst, hole, or pseudohole, bilateral: Secondary | ICD-10-CM

## 2021-11-17 DIAGNOSIS — D3132 Benign neoplasm of left choroid: Secondary | ICD-10-CM | POA: Diagnosis not present

## 2021-11-17 NOTE — Progress Notes (Signed)
11/17/2021     CHIEF COMPLAINT Patient presents for  Chief Complaint  Patient presents with   Macular Degeneration      HISTORY OF PRESENT ILLNESS: Kayla Bruce is a 81 y.o. female who presents to the clinic today for:   HPI   1 yr FU OU OCT FP. Patient allergy to Ciprofloxacin. Patient states vision is stable and unchanged since last visit. Denies any new floaters or FOL. Patient has floaters all the time.  Last edited by Laurin Coder on 11/17/2021  1:00 PM.      Referring physician: Neale Burly, MD [place],  [place] [address]  HISTORICAL INFORMATION:   Selected notes from the MEDICAL RECORD NUMBER    Lab Results  Component Value Date   HGBA1C 6.1 (H) 07/18/2021     CURRENT MEDICATIONS: No current outpatient medications on file. (Ophthalmic Drugs)   No current facility-administered medications for this visit. (Ophthalmic Drugs)   Current Outpatient Medications (Other)  Medication Sig   ALPRAZolam (XANAX) 0.5 MG tablet Take 0.5 mg by mouth 2 (two) times daily as needed for anxiety.   amLODipine (NORVASC) 5 MG tablet Take 5 mg by mouth daily.   atenolol (TENORMIN) 50 MG tablet Take 50 mg by mouth at bedtime.   atorvastatin (LIPITOR) 10 MG tablet Take 10 mg by mouth daily.   Cholecalciferol (VITAMIN D3 PO) Take 1 tablet by mouth daily.   colchicine 0.6 MG tablet Take 1 tablet (0.6 mg total) by mouth daily.   lactose free nutrition (BOOST) LIQD Take 237 mLs by mouth daily.   levothyroxine (SYNTHROID, LEVOTHROID) 25 MCG tablet Take 25 mcg by mouth daily.   MAGNESIUM PO Take 1 tablet by mouth daily.   mometasone (NASONEX) 50 MCG/ACT nasal spray Place 2 sprays into the nose at bedtime.   Omega-3 Fatty Acids (FISH OIL PO) Take 1 capsule by mouth daily.   pantoprazole (PROTONIX) 40 MG tablet Take 40 mg by mouth daily as needed (heartburn).   sertraline (ZOLOFT) 50 MG tablet Take 50 mg by mouth daily.   telmisartan (MICARDIS) 80 MG tablet  Take 80 mg by mouth daily.   triazolam (HALCION) 0.25 MG tablet Take 0.25 mg by mouth at bedtime as needed for sleep.   zinc gluconate 50 MG tablet Take 50 mg by mouth daily.   No current facility-administered medications for this visit. (Other)      REVIEW OF SYSTEMS:    ALLERGIES Allergies  Allergen Reactions   Micardis Hct [Telmisartan-Hctz]     Flushed system of sodium    Ceclor [Cefaclor] Rash and Other (See Comments)    headache   Ciprofloxacin Hcl Swelling and Rash    Arm was swelling and rash all over   Codeine Nausea Only and Other (See Comments)    headache    Levaquin [Levofloxacin In D5w] Swelling and Rash    Arm swelled    Penicillins Rash   Sulfa Antibiotics Rash    PAST MEDICAL HISTORY Past Medical History:  Diagnosis Date   Anxiety    Arthritis    Depression    High cholesterol    Hypertension    Hypothyroidism    PONV (postoperative nausea and vomiting)    Pre-diabetes    Past Surgical History:  Procedure Laterality Date   ANTERIOR CERVICAL DECOMP/DISCECTOMY FUSION     CATARACT EXTRACTION     left 2008   CATARACT EXTRACTION Right    2001   GALLBLADDER  SURGERY     1997   KNEE ARTHROSCOPY     rt knee 2008   VAGINAL HYSTERECTOMY     1979   VITRECTOMY     left eye 2009, right eye 2012    FAMILY HISTORY Family History  Problem Relation Age of Onset   Diabetes Mother    Macular degeneration Mother    Hypertension Mother    Diabetes Sister    Hypertension Sister    Hypertension Brother     SOCIAL HISTORY Social History   Tobacco Use   Smoking status: Never   Smokeless tobacco: Never  Vaping Use   Vaping Use: Never used  Substance Use Topics   Alcohol use: No    Comment: occasasional social    Drug use: Never         OPHTHALMIC EXAM:  Base Eye Exam     Visual Acuity (ETDRS)       Right Left   Dist cc 20/40 +2 20/40 -2   Dist ph cc NI 20/30 -2    Correction: Glasses         Tonometry (Tonopen, 1:08 PM)        Right Left   Pressure 12 14         Pupils       Dark Light Shape APD   Right 4 3 Irregular None   Left 4 3 Round None         Visual Fields (Counting fingers)       Left Right    Full Full         Extraocular Movement       Right Left    Full Full         Neuro/Psych     Oriented x3: Yes   Mood/Affect: Normal         Dilation     Both eyes: 1.0% Mydriacyl, 2.5% Phenylephrine @ 1:08 PM           Slit Lamp and Fundus Exam     External Exam       Right Left   External Normal Normal         Slit Lamp Exam       Right Left   Lids/Lashes Normal Normal   Conjunctiva/Sclera White and quiet White and quiet   Cornea Clear Clear   Anterior Chamber Deep and quiet Deep and quiet   Iris Round and reactive Round and reactive   Lens Posterior chamber intraocular lens Posterior chamber intraocular lens   Anterior Vitreous Normal Normal         Fundus Exam       Right Left   Posterior Vitreous Normal, clear avitric Normal, clear avitric   Disc Normal Normal   C/D Ratio 0.45 0.45   Macula Normal Choroidal nevus, small, 1.2 disc diameters in size along the superotemporal arcade, stable over time   Vessels Normal Normal   Periphery Normal Normal            IMAGING AND PROCEDURES  Imaging and Procedures for 11/17/21  OCT, Retina - OU - Both Eyes       Right Eye Central Foveal Thickness: 326. Progression has been stable.   Left Eye Central Foveal Thickness: 344. Progression has been stable.      Color Fundus Photography Optos - OU - Both Eyes       Right Eye Progression has been stable. Disc findings include normal observations. Macula : normal observations.  Periphery : normal observations.   Left Eye Progression has been stable. Disc findings include normal observations. Macula : normal observations. Vessels : normal observations.   Notes Choroidal nevus OS, small along the superotemporal arcade 1.2 disc diameter, flat,  no lipofuscin, no fluid, no halos no atrophy, stable year-over-year, and for the last 7 years             ASSESSMENT/PLAN:  Macular hole of both eyes Repaired years previous stable  Benign neoplasm of left choroid Stable left eye no interval change, no high risk features     ICD-10-CM   1. Early stage nonexudative age-related macular degeneration of both eyes  H35.3131 OCT, Retina - OU - Both Eyes    Color Fundus Photography Optos - OU - Both Eyes    2. Macular hole of both eyes  H35.343 Color Fundus Photography Optos - OU - Both Eyes    3. Benign neoplasm of left choroid  D31.32       1.  Benign small choroidal nevus left eye, no interval change over last 7 years.  2.  OU with a history of macular hole repair with Dr. Zadie Rhine over the subsequent 10 to 15 years.  Excellent acuity regained and maintained  3.  Ophthalmic Meds Ordered this visit:  No orders of the defined types were placed in this encounter.      Return in about 1 year (around 11/18/2022) for DILATE OU, COLOR FP, OCT.  There are no Patient Instructions on file for this visit.   Explained the diagnoses, plan, and follow up with the patient and they expressed understanding.  Patient expressed understanding of the importance of proper follow up care.   Clent Demark Charee Tumblin M.D. Diseases & Surgery of the Retina and Vitreous [facility] [place] 11/17/21     Abbreviations: M myopia (nearsighted); A astigmatism; H hyperopia (farsighted); P presbyopia; Mrx spectacle prescription;  CTL contact lenses; OD right eye; OS left eye; OU both eyes  XT exotropia; ET esotropia; PEK punctate epithelial keratitis; PEE punctate epithelial erosions; DES dry eye syndrome; MGD meibomian gland dysfunction; ATs artificial tears; PFAT's preservative free artificial tears; [place] nuclear sclerotic cataract; PSC posterior subcapsular cataract; ERM epi-retinal membrane; PVD posterior vitreous detachment; RD retinal detachment; DM  diabetes mellitus; DR diabetic retinopathy; NPDR non-proliferative diabetic retinopathy; PDR proliferative diabetic retinopathy; CSME clinically significant macular edema; DME diabetic macular edema; dbh dot blot hemorrhages; CWS cotton wool spot; POAG primary open angle glaucoma; C/D cup-to-disc ratio; HVF humphrey visual field; GVF goldmann visual field; OCT optical coherence tomography; IOP intraocular pressure; BRVO Branch retinal vein occlusion; CRVO central retinal vein occlusion; CRAO central retinal artery occlusion; BRAO branch retinal artery occlusion; RT retinal tear; SB scleral buckle; PPV pars plana vitrectomy; VH Vitreous hemorrhage; PRP panretinal laser photocoagulation; IVK intravitreal kenalog; VMT vitreomacular traction; MH Macular hole;  NVD neovascularization of the disc; NVE neovascularization elsewhere; AREDS age related eye disease study; ARMD age related macular degeneration; POAG primary open angle glaucoma; EBMD epithelial/anterior basement membrane dystrophy; ACIOL anterior chamber intraocular lens; IOL intraocular lens; PCIOL posterior chamber intraocular lens; Phaco/IOL phacoemulsification with intraocular lens placement; [place] photorefractive keratectomy; LASIK laser assisted in situ keratomileusis; HTN hypertension; DM diabetes mellitus; COPD chronic obstructive pulmonary disease

## 2021-11-17 NOTE — Assessment & Plan Note (Signed)
Repaired years previous stable

## 2021-11-17 NOTE — Assessment & Plan Note (Signed)
Stable left eye no interval change, no high risk features

## 2022-01-03 DIAGNOSIS — K219 Gastro-esophageal reflux disease without esophagitis: Secondary | ICD-10-CM | POA: Diagnosis not present

## 2022-01-03 DIAGNOSIS — Z0000 Encounter for general adult medical examination without abnormal findings: Secondary | ICD-10-CM | POA: Diagnosis not present

## 2022-01-03 DIAGNOSIS — I10 Essential (primary) hypertension: Secondary | ICD-10-CM | POA: Diagnosis not present

## 2022-01-03 DIAGNOSIS — Z6828 Body mass index (BMI) 28.0-28.9, adult: Secondary | ICD-10-CM | POA: Diagnosis not present

## 2022-01-03 DIAGNOSIS — I700 Atherosclerosis of aorta: Secondary | ICD-10-CM | POA: Diagnosis not present

## 2022-01-03 DIAGNOSIS — E038 Other specified hypothyroidism: Secondary | ICD-10-CM | POA: Diagnosis not present

## 2022-01-03 DIAGNOSIS — F411 Generalized anxiety disorder: Secondary | ICD-10-CM | POA: Diagnosis not present

## 2022-01-03 DIAGNOSIS — E785 Hyperlipidemia, unspecified: Secondary | ICD-10-CM | POA: Diagnosis not present

## 2022-02-08 DIAGNOSIS — H04123 Dry eye syndrome of bilateral lacrimal glands: Secondary | ICD-10-CM | POA: Diagnosis not present

## 2022-02-08 DIAGNOSIS — Z6829 Body mass index (BMI) 29.0-29.9, adult: Secondary | ICD-10-CM | POA: Diagnosis not present

## 2022-02-08 DIAGNOSIS — M79641 Pain in right hand: Secondary | ICD-10-CM | POA: Diagnosis not present

## 2022-02-08 DIAGNOSIS — R682 Dry mouth, unspecified: Secondary | ICD-10-CM | POA: Diagnosis not present

## 2022-02-08 DIAGNOSIS — D72819 Decreased white blood cell count, unspecified: Secondary | ICD-10-CM | POA: Diagnosis not present

## 2022-02-08 DIAGNOSIS — E663 Overweight: Secondary | ICD-10-CM | POA: Diagnosis not present

## 2022-02-08 DIAGNOSIS — R768 Other specified abnormal immunological findings in serum: Secondary | ICD-10-CM | POA: Diagnosis not present

## 2022-02-08 DIAGNOSIS — R5383 Other fatigue: Secondary | ICD-10-CM | POA: Diagnosis not present

## 2022-02-08 DIAGNOSIS — M79642 Pain in left hand: Secondary | ICD-10-CM | POA: Diagnosis not present

## 2022-03-01 DIAGNOSIS — M79642 Pain in left hand: Secondary | ICD-10-CM | POA: Diagnosis not present

## 2022-03-01 DIAGNOSIS — D72819 Decreased white blood cell count, unspecified: Secondary | ICD-10-CM | POA: Diagnosis not present

## 2022-03-01 DIAGNOSIS — M79641 Pain in right hand: Secondary | ICD-10-CM | POA: Diagnosis not present

## 2022-03-01 DIAGNOSIS — M154 Erosive (osteo)arthritis: Secondary | ICD-10-CM | POA: Diagnosis not present

## 2022-03-01 DIAGNOSIS — Z6829 Body mass index (BMI) 29.0-29.9, adult: Secondary | ICD-10-CM | POA: Diagnosis not present

## 2022-03-01 DIAGNOSIS — R768 Other specified abnormal immunological findings in serum: Secondary | ICD-10-CM | POA: Diagnosis not present

## 2022-03-01 DIAGNOSIS — E663 Overweight: Secondary | ICD-10-CM | POA: Diagnosis not present

## 2022-03-01 DIAGNOSIS — R5383 Other fatigue: Secondary | ICD-10-CM | POA: Diagnosis not present

## 2022-03-01 DIAGNOSIS — M1120 Other chondrocalcinosis, unspecified site: Secondary | ICD-10-CM | POA: Diagnosis not present

## 2022-03-21 DIAGNOSIS — D72819 Decreased white blood cell count, unspecified: Secondary | ICD-10-CM | POA: Diagnosis not present

## 2022-03-23 DIAGNOSIS — D709 Neutropenia, unspecified: Secondary | ICD-10-CM | POA: Diagnosis not present

## 2022-04-10 DIAGNOSIS — E038 Other specified hypothyroidism: Secondary | ICD-10-CM | POA: Diagnosis not present

## 2022-04-10 DIAGNOSIS — F411 Generalized anxiety disorder: Secondary | ICD-10-CM | POA: Diagnosis not present

## 2022-04-10 DIAGNOSIS — K219 Gastro-esophageal reflux disease without esophagitis: Secondary | ICD-10-CM | POA: Diagnosis not present

## 2022-04-10 DIAGNOSIS — Z6828 Body mass index (BMI) 28.0-28.9, adult: Secondary | ICD-10-CM | POA: Diagnosis not present

## 2022-04-10 DIAGNOSIS — E785 Hyperlipidemia, unspecified: Secondary | ICD-10-CM | POA: Diagnosis not present

## 2022-04-10 DIAGNOSIS — I10 Essential (primary) hypertension: Secondary | ICD-10-CM | POA: Diagnosis not present

## 2022-04-10 DIAGNOSIS — I700 Atherosclerosis of aorta: Secondary | ICD-10-CM | POA: Diagnosis not present

## 2022-05-03 DIAGNOSIS — M1120 Other chondrocalcinosis, unspecified site: Secondary | ICD-10-CM | POA: Diagnosis not present

## 2022-05-03 DIAGNOSIS — M154 Erosive (osteo)arthritis: Secondary | ICD-10-CM | POA: Diagnosis not present

## 2022-05-03 DIAGNOSIS — R768 Other specified abnormal immunological findings in serum: Secondary | ICD-10-CM | POA: Diagnosis not present

## 2022-05-03 DIAGNOSIS — D72819 Decreased white blood cell count, unspecified: Secondary | ICD-10-CM | POA: Diagnosis not present

## 2022-05-03 DIAGNOSIS — E663 Overweight: Secondary | ICD-10-CM | POA: Diagnosis not present

## 2022-05-03 DIAGNOSIS — M79641 Pain in right hand: Secondary | ICD-10-CM | POA: Diagnosis not present

## 2022-05-03 DIAGNOSIS — M79642 Pain in left hand: Secondary | ICD-10-CM | POA: Diagnosis not present

## 2022-05-03 DIAGNOSIS — Z6829 Body mass index (BMI) 29.0-29.9, adult: Secondary | ICD-10-CM | POA: Diagnosis not present

## 2022-07-10 DIAGNOSIS — Z0000 Encounter for general adult medical examination without abnormal findings: Secondary | ICD-10-CM | POA: Diagnosis not present

## 2022-07-10 DIAGNOSIS — E038 Other specified hypothyroidism: Secondary | ICD-10-CM | POA: Diagnosis not present

## 2022-07-10 DIAGNOSIS — Z6828 Body mass index (BMI) 28.0-28.9, adult: Secondary | ICD-10-CM | POA: Diagnosis not present

## 2022-07-10 DIAGNOSIS — K219 Gastro-esophageal reflux disease without esophagitis: Secondary | ICD-10-CM | POA: Diagnosis not present

## 2022-07-10 DIAGNOSIS — F411 Generalized anxiety disorder: Secondary | ICD-10-CM | POA: Diagnosis not present

## 2022-07-10 DIAGNOSIS — I10 Essential (primary) hypertension: Secondary | ICD-10-CM | POA: Diagnosis not present

## 2022-07-10 DIAGNOSIS — E785 Hyperlipidemia, unspecified: Secondary | ICD-10-CM | POA: Diagnosis not present

## 2022-07-10 DIAGNOSIS — I700 Atherosclerosis of aorta: Secondary | ICD-10-CM | POA: Diagnosis not present

## 2022-07-31 DIAGNOSIS — H40013 Open angle with borderline findings, low risk, bilateral: Secondary | ICD-10-CM | POA: Diagnosis not present

## 2022-08-03 DIAGNOSIS — D72819 Decreased white blood cell count, unspecified: Secondary | ICD-10-CM | POA: Diagnosis not present

## 2022-08-03 DIAGNOSIS — M79641 Pain in right hand: Secondary | ICD-10-CM | POA: Diagnosis not present

## 2022-08-03 DIAGNOSIS — M1120 Other chondrocalcinosis, unspecified site: Secondary | ICD-10-CM | POA: Diagnosis not present

## 2022-08-03 DIAGNOSIS — M79642 Pain in left hand: Secondary | ICD-10-CM | POA: Diagnosis not present

## 2022-08-03 DIAGNOSIS — Z6829 Body mass index (BMI) 29.0-29.9, adult: Secondary | ICD-10-CM | POA: Diagnosis not present

## 2022-08-03 DIAGNOSIS — E663 Overweight: Secondary | ICD-10-CM | POA: Diagnosis not present

## 2022-08-03 DIAGNOSIS — M154 Erosive (osteo)arthritis: Secondary | ICD-10-CM | POA: Diagnosis not present

## 2022-08-03 DIAGNOSIS — M25511 Pain in right shoulder: Secondary | ICD-10-CM | POA: Diagnosis not present

## 2022-08-03 DIAGNOSIS — R768 Other specified abnormal immunological findings in serum: Secondary | ICD-10-CM | POA: Diagnosis not present

## 2022-10-09 DIAGNOSIS — Z0000 Encounter for general adult medical examination without abnormal findings: Secondary | ICD-10-CM | POA: Diagnosis not present

## 2022-10-09 DIAGNOSIS — E038 Other specified hypothyroidism: Secondary | ICD-10-CM | POA: Diagnosis not present

## 2022-10-09 DIAGNOSIS — I10 Essential (primary) hypertension: Secondary | ICD-10-CM | POA: Diagnosis not present

## 2022-10-09 DIAGNOSIS — Z6828 Body mass index (BMI) 28.0-28.9, adult: Secondary | ICD-10-CM | POA: Diagnosis not present

## 2022-10-09 DIAGNOSIS — F411 Generalized anxiety disorder: Secondary | ICD-10-CM | POA: Diagnosis not present

## 2022-10-09 DIAGNOSIS — E785 Hyperlipidemia, unspecified: Secondary | ICD-10-CM | POA: Diagnosis not present

## 2022-10-09 DIAGNOSIS — K219 Gastro-esophageal reflux disease without esophagitis: Secondary | ICD-10-CM | POA: Diagnosis not present

## 2022-10-09 DIAGNOSIS — I700 Atherosclerosis of aorta: Secondary | ICD-10-CM | POA: Diagnosis not present

## 2022-11-21 ENCOUNTER — Encounter (INDEPENDENT_AMBULATORY_CARE_PROVIDER_SITE_OTHER): Payer: Self-pay

## 2022-11-21 ENCOUNTER — Encounter (INDEPENDENT_AMBULATORY_CARE_PROVIDER_SITE_OTHER): Payer: Medicare PPO | Admitting: Ophthalmology

## 2022-11-21 DIAGNOSIS — H353131 Nonexudative age-related macular degeneration, bilateral, early dry stage: Secondary | ICD-10-CM | POA: Diagnosis not present

## 2022-11-21 DIAGNOSIS — D3132 Benign neoplasm of left choroid: Secondary | ICD-10-CM | POA: Diagnosis not present

## 2022-11-21 DIAGNOSIS — H35363 Drusen (degenerative) of macula, bilateral: Secondary | ICD-10-CM | POA: Diagnosis not present

## 2022-11-21 DIAGNOSIS — H40013 Open angle with borderline findings, low risk, bilateral: Secondary | ICD-10-CM | POA: Diagnosis not present

## 2022-11-30 DIAGNOSIS — Z6829 Body mass index (BMI) 29.0-29.9, adult: Secondary | ICD-10-CM | POA: Diagnosis not present

## 2022-11-30 DIAGNOSIS — M1120 Other chondrocalcinosis, unspecified site: Secondary | ICD-10-CM | POA: Diagnosis not present

## 2022-11-30 DIAGNOSIS — M25511 Pain in right shoulder: Secondary | ICD-10-CM | POA: Diagnosis not present

## 2022-11-30 DIAGNOSIS — M1991 Primary osteoarthritis, unspecified site: Secondary | ICD-10-CM | POA: Diagnosis not present

## 2022-11-30 DIAGNOSIS — M79641 Pain in right hand: Secondary | ICD-10-CM | POA: Diagnosis not present

## 2022-11-30 DIAGNOSIS — D72819 Decreased white blood cell count, unspecified: Secondary | ICD-10-CM | POA: Diagnosis not present

## 2022-11-30 DIAGNOSIS — M2540 Effusion, unspecified joint: Secondary | ICD-10-CM | POA: Diagnosis not present

## 2022-11-30 DIAGNOSIS — M154 Erosive (osteo)arthritis: Secondary | ICD-10-CM | POA: Diagnosis not present

## 2022-11-30 DIAGNOSIS — R768 Other specified abnormal immunological findings in serum: Secondary | ICD-10-CM | POA: Diagnosis not present

## 2022-11-30 DIAGNOSIS — M79642 Pain in left hand: Secondary | ICD-10-CM | POA: Diagnosis not present

## 2022-12-26 DIAGNOSIS — M4316 Spondylolisthesis, lumbar region: Secondary | ICD-10-CM | POA: Diagnosis not present

## 2022-12-26 DIAGNOSIS — M48062 Spinal stenosis, lumbar region with neurogenic claudication: Secondary | ICD-10-CM | POA: Diagnosis not present

## 2022-12-26 DIAGNOSIS — M5136 Other intervertebral disc degeneration, lumbar region: Secondary | ICD-10-CM | POA: Diagnosis not present

## 2022-12-26 DIAGNOSIS — M438X6 Other specified deforming dorsopathies, lumbar region: Secondary | ICD-10-CM | POA: Diagnosis not present

## 2023-01-08 DIAGNOSIS — Z6828 Body mass index (BMI) 28.0-28.9, adult: Secondary | ICD-10-CM | POA: Diagnosis not present

## 2023-01-08 DIAGNOSIS — K219 Gastro-esophageal reflux disease without esophagitis: Secondary | ICD-10-CM | POA: Diagnosis not present

## 2023-01-08 DIAGNOSIS — E785 Hyperlipidemia, unspecified: Secondary | ICD-10-CM | POA: Diagnosis not present

## 2023-01-08 DIAGNOSIS — I700 Atherosclerosis of aorta: Secondary | ICD-10-CM | POA: Diagnosis not present

## 2023-01-08 DIAGNOSIS — M4800 Spinal stenosis, site unspecified: Secondary | ICD-10-CM | POA: Diagnosis not present

## 2023-01-08 DIAGNOSIS — F411 Generalized anxiety disorder: Secondary | ICD-10-CM | POA: Diagnosis not present

## 2023-01-08 DIAGNOSIS — E038 Other specified hypothyroidism: Secondary | ICD-10-CM | POA: Diagnosis not present

## 2023-01-08 DIAGNOSIS — I10 Essential (primary) hypertension: Secondary | ICD-10-CM | POA: Diagnosis not present

## 2023-01-23 DIAGNOSIS — M4316 Spondylolisthesis, lumbar region: Secondary | ICD-10-CM | POA: Diagnosis not present

## 2023-01-23 DIAGNOSIS — M4726 Other spondylosis with radiculopathy, lumbar region: Secondary | ICD-10-CM | POA: Diagnosis not present

## 2023-01-23 DIAGNOSIS — M5116 Intervertebral disc disorders with radiculopathy, lumbar region: Secondary | ICD-10-CM | POA: Diagnosis not present

## 2023-01-23 DIAGNOSIS — M48062 Spinal stenosis, lumbar region with neurogenic claudication: Secondary | ICD-10-CM | POA: Diagnosis not present

## 2023-01-30 DIAGNOSIS — Z6829 Body mass index (BMI) 29.0-29.9, adult: Secondary | ICD-10-CM | POA: Diagnosis not present

## 2023-01-30 DIAGNOSIS — M1120 Other chondrocalcinosis, unspecified site: Secondary | ICD-10-CM | POA: Diagnosis not present

## 2023-01-30 DIAGNOSIS — M25511 Pain in right shoulder: Secondary | ICD-10-CM | POA: Diagnosis not present

## 2023-01-30 DIAGNOSIS — M79641 Pain in right hand: Secondary | ICD-10-CM | POA: Diagnosis not present

## 2023-01-30 DIAGNOSIS — M79642 Pain in left hand: Secondary | ICD-10-CM | POA: Diagnosis not present

## 2023-01-30 DIAGNOSIS — D72819 Decreased white blood cell count, unspecified: Secondary | ICD-10-CM | POA: Diagnosis not present

## 2023-01-30 DIAGNOSIS — R768 Other specified abnormal immunological findings in serum: Secondary | ICD-10-CM | POA: Diagnosis not present

## 2023-01-30 DIAGNOSIS — M2540 Effusion, unspecified joint: Secondary | ICD-10-CM | POA: Diagnosis not present

## 2023-01-30 DIAGNOSIS — M1991 Primary osteoarthritis, unspecified site: Secondary | ICD-10-CM | POA: Diagnosis not present

## 2023-02-15 DIAGNOSIS — I10 Essential (primary) hypertension: Secondary | ICD-10-CM | POA: Diagnosis not present

## 2023-02-15 DIAGNOSIS — Z6828 Body mass index (BMI) 28.0-28.9, adult: Secondary | ICD-10-CM | POA: Diagnosis not present

## 2023-02-15 DIAGNOSIS — M4800 Spinal stenosis, site unspecified: Secondary | ICD-10-CM | POA: Diagnosis not present

## 2023-04-09 DIAGNOSIS — E038 Other specified hypothyroidism: Secondary | ICD-10-CM | POA: Diagnosis not present

## 2023-04-09 DIAGNOSIS — I10 Essential (primary) hypertension: Secondary | ICD-10-CM | POA: Diagnosis not present

## 2023-04-09 DIAGNOSIS — K219 Gastro-esophageal reflux disease without esophagitis: Secondary | ICD-10-CM | POA: Diagnosis not present

## 2023-04-09 DIAGNOSIS — I700 Atherosclerosis of aorta: Secondary | ICD-10-CM | POA: Diagnosis not present

## 2023-04-09 DIAGNOSIS — M4800 Spinal stenosis, site unspecified: Secondary | ICD-10-CM | POA: Diagnosis not present

## 2023-04-09 DIAGNOSIS — F411 Generalized anxiety disorder: Secondary | ICD-10-CM | POA: Diagnosis not present

## 2023-04-09 DIAGNOSIS — Z6828 Body mass index (BMI) 28.0-28.9, adult: Secondary | ICD-10-CM | POA: Diagnosis not present

## 2023-04-09 DIAGNOSIS — E785 Hyperlipidemia, unspecified: Secondary | ICD-10-CM | POA: Diagnosis not present

## 2023-04-09 DIAGNOSIS — E119 Type 2 diabetes mellitus without complications: Secondary | ICD-10-CM | POA: Diagnosis not present

## 2023-07-09 DIAGNOSIS — E038 Other specified hypothyroidism: Secondary | ICD-10-CM | POA: Diagnosis not present

## 2023-07-09 DIAGNOSIS — K219 Gastro-esophageal reflux disease without esophagitis: Secondary | ICD-10-CM | POA: Diagnosis not present

## 2023-07-09 DIAGNOSIS — I10 Essential (primary) hypertension: Secondary | ICD-10-CM | POA: Diagnosis not present

## 2023-07-09 DIAGNOSIS — M4800 Spinal stenosis, site unspecified: Secondary | ICD-10-CM | POA: Diagnosis not present

## 2023-07-09 DIAGNOSIS — E1122 Type 2 diabetes mellitus with diabetic chronic kidney disease: Secondary | ICD-10-CM | POA: Diagnosis not present

## 2023-07-09 DIAGNOSIS — I700 Atherosclerosis of aorta: Secondary | ICD-10-CM | POA: Diagnosis not present

## 2023-07-09 DIAGNOSIS — E785 Hyperlipidemia, unspecified: Secondary | ICD-10-CM | POA: Diagnosis not present

## 2023-07-09 DIAGNOSIS — F411 Generalized anxiety disorder: Secondary | ICD-10-CM | POA: Diagnosis not present

## 2023-07-09 DIAGNOSIS — N182 Chronic kidney disease, stage 2 (mild): Secondary | ICD-10-CM | POA: Diagnosis not present

## 2023-07-31 DIAGNOSIS — M4800 Spinal stenosis, site unspecified: Secondary | ICD-10-CM | POA: Diagnosis not present

## 2023-07-31 DIAGNOSIS — R29898 Other symptoms and signs involving the musculoskeletal system: Secondary | ICD-10-CM | POA: Diagnosis not present

## 2023-08-02 DIAGNOSIS — H353132 Nonexudative age-related macular degeneration, bilateral, intermediate dry stage: Secondary | ICD-10-CM | POA: Diagnosis not present

## 2023-08-09 DIAGNOSIS — M4800 Spinal stenosis, site unspecified: Secondary | ICD-10-CM | POA: Diagnosis not present

## 2023-08-09 DIAGNOSIS — R29898 Other symptoms and signs involving the musculoskeletal system: Secondary | ICD-10-CM | POA: Diagnosis not present

## 2023-08-13 DIAGNOSIS — M4800 Spinal stenosis, site unspecified: Secondary | ICD-10-CM | POA: Diagnosis not present

## 2023-08-13 DIAGNOSIS — R29898 Other symptoms and signs involving the musculoskeletal system: Secondary | ICD-10-CM | POA: Diagnosis not present

## 2023-08-16 DIAGNOSIS — M4800 Spinal stenosis, site unspecified: Secondary | ICD-10-CM | POA: Diagnosis not present

## 2023-08-16 DIAGNOSIS — R29898 Other symptoms and signs involving the musculoskeletal system: Secondary | ICD-10-CM | POA: Diagnosis not present

## 2023-08-20 DIAGNOSIS — R29898 Other symptoms and signs involving the musculoskeletal system: Secondary | ICD-10-CM | POA: Diagnosis not present

## 2023-08-20 DIAGNOSIS — M4800 Spinal stenosis, site unspecified: Secondary | ICD-10-CM | POA: Diagnosis not present

## 2023-08-23 DIAGNOSIS — R29898 Other symptoms and signs involving the musculoskeletal system: Secondary | ICD-10-CM | POA: Diagnosis not present

## 2023-08-23 DIAGNOSIS — M4800 Spinal stenosis, site unspecified: Secondary | ICD-10-CM | POA: Diagnosis not present

## 2023-08-27 DIAGNOSIS — M4800 Spinal stenosis, site unspecified: Secondary | ICD-10-CM | POA: Diagnosis not present

## 2023-08-27 DIAGNOSIS — R29898 Other symptoms and signs involving the musculoskeletal system: Secondary | ICD-10-CM | POA: Diagnosis not present

## 2023-08-29 DIAGNOSIS — M4800 Spinal stenosis, site unspecified: Secondary | ICD-10-CM | POA: Diagnosis not present

## 2023-08-29 DIAGNOSIS — R29898 Other symptoms and signs involving the musculoskeletal system: Secondary | ICD-10-CM | POA: Diagnosis not present

## 2023-09-06 DIAGNOSIS — R29898 Other symptoms and signs involving the musculoskeletal system: Secondary | ICD-10-CM | POA: Diagnosis not present

## 2023-09-06 DIAGNOSIS — M4800 Spinal stenosis, site unspecified: Secondary | ICD-10-CM | POA: Diagnosis not present

## 2023-09-12 DIAGNOSIS — R29898 Other symptoms and signs involving the musculoskeletal system: Secondary | ICD-10-CM | POA: Diagnosis not present

## 2023-09-12 DIAGNOSIS — M4800 Spinal stenosis, site unspecified: Secondary | ICD-10-CM | POA: Diagnosis not present

## 2023-09-18 DIAGNOSIS — M4800 Spinal stenosis, site unspecified: Secondary | ICD-10-CM | POA: Diagnosis not present

## 2023-09-18 DIAGNOSIS — R29898 Other symptoms and signs involving the musculoskeletal system: Secondary | ICD-10-CM | POA: Diagnosis not present

## 2023-09-20 DIAGNOSIS — R29898 Other symptoms and signs involving the musculoskeletal system: Secondary | ICD-10-CM | POA: Diagnosis not present

## 2023-09-20 DIAGNOSIS — M4800 Spinal stenosis, site unspecified: Secondary | ICD-10-CM | POA: Diagnosis not present

## 2023-09-25 DIAGNOSIS — M4800 Spinal stenosis, site unspecified: Secondary | ICD-10-CM | POA: Diagnosis not present

## 2023-09-25 DIAGNOSIS — R29898 Other symptoms and signs involving the musculoskeletal system: Secondary | ICD-10-CM | POA: Diagnosis not present

## 2023-10-02 DIAGNOSIS — R29898 Other symptoms and signs involving the musculoskeletal system: Secondary | ICD-10-CM | POA: Diagnosis not present

## 2023-10-02 DIAGNOSIS — M4800 Spinal stenosis, site unspecified: Secondary | ICD-10-CM | POA: Diagnosis not present

## 2023-10-04 DIAGNOSIS — R29898 Other symptoms and signs involving the musculoskeletal system: Secondary | ICD-10-CM | POA: Diagnosis not present

## 2023-10-04 DIAGNOSIS — M4800 Spinal stenosis, site unspecified: Secondary | ICD-10-CM | POA: Diagnosis not present

## 2023-10-08 DIAGNOSIS — K219 Gastro-esophageal reflux disease without esophagitis: Secondary | ICD-10-CM | POA: Diagnosis not present

## 2023-10-08 DIAGNOSIS — E038 Other specified hypothyroidism: Secondary | ICD-10-CM | POA: Diagnosis not present

## 2023-10-08 DIAGNOSIS — N182 Chronic kidney disease, stage 2 (mild): Secondary | ICD-10-CM | POA: Diagnosis not present

## 2023-10-08 DIAGNOSIS — I10 Essential (primary) hypertension: Secondary | ICD-10-CM | POA: Diagnosis not present

## 2023-10-08 DIAGNOSIS — F411 Generalized anxiety disorder: Secondary | ICD-10-CM | POA: Diagnosis not present

## 2023-10-08 DIAGNOSIS — M4800 Spinal stenosis, site unspecified: Secondary | ICD-10-CM | POA: Diagnosis not present

## 2023-10-08 DIAGNOSIS — E785 Hyperlipidemia, unspecified: Secondary | ICD-10-CM | POA: Diagnosis not present

## 2023-10-08 DIAGNOSIS — E1122 Type 2 diabetes mellitus with diabetic chronic kidney disease: Secondary | ICD-10-CM | POA: Diagnosis not present

## 2023-10-08 DIAGNOSIS — I700 Atherosclerosis of aorta: Secondary | ICD-10-CM | POA: Diagnosis not present

## 2023-10-09 DIAGNOSIS — M4800 Spinal stenosis, site unspecified: Secondary | ICD-10-CM | POA: Diagnosis not present

## 2023-10-09 DIAGNOSIS — R29898 Other symptoms and signs involving the musculoskeletal system: Secondary | ICD-10-CM | POA: Diagnosis not present

## 2023-10-10 DIAGNOSIS — R29898 Other symptoms and signs involving the musculoskeletal system: Secondary | ICD-10-CM | POA: Diagnosis not present

## 2023-10-10 DIAGNOSIS — M4800 Spinal stenosis, site unspecified: Secondary | ICD-10-CM | POA: Diagnosis not present

## 2023-10-15 DIAGNOSIS — R29898 Other symptoms and signs involving the musculoskeletal system: Secondary | ICD-10-CM | POA: Diagnosis not present

## 2023-10-15 DIAGNOSIS — M4800 Spinal stenosis, site unspecified: Secondary | ICD-10-CM | POA: Diagnosis not present

## 2023-10-17 DIAGNOSIS — R29898 Other symptoms and signs involving the musculoskeletal system: Secondary | ICD-10-CM | POA: Diagnosis not present

## 2023-10-17 DIAGNOSIS — M4800 Spinal stenosis, site unspecified: Secondary | ICD-10-CM | POA: Diagnosis not present

## 2023-10-22 DIAGNOSIS — M4800 Spinal stenosis, site unspecified: Secondary | ICD-10-CM | POA: Diagnosis not present

## 2023-10-22 DIAGNOSIS — R29898 Other symptoms and signs involving the musculoskeletal system: Secondary | ICD-10-CM | POA: Diagnosis not present

## 2023-10-24 DIAGNOSIS — M4800 Spinal stenosis, site unspecified: Secondary | ICD-10-CM | POA: Diagnosis not present

## 2023-10-24 DIAGNOSIS — R29898 Other symptoms and signs involving the musculoskeletal system: Secondary | ICD-10-CM | POA: Diagnosis not present

## 2023-11-05 DIAGNOSIS — R29898 Other symptoms and signs involving the musculoskeletal system: Secondary | ICD-10-CM | POA: Diagnosis not present

## 2023-11-05 DIAGNOSIS — M4800 Spinal stenosis, site unspecified: Secondary | ICD-10-CM | POA: Diagnosis not present

## 2023-11-21 DIAGNOSIS — H40013 Open angle with borderline findings, low risk, bilateral: Secondary | ICD-10-CM | POA: Diagnosis not present

## 2023-11-21 DIAGNOSIS — D3132 Benign neoplasm of left choroid: Secondary | ICD-10-CM | POA: Diagnosis not present

## 2023-11-21 DIAGNOSIS — E119 Type 2 diabetes mellitus without complications: Secondary | ICD-10-CM | POA: Diagnosis not present

## 2023-11-21 DIAGNOSIS — H35363 Drusen (degenerative) of macula, bilateral: Secondary | ICD-10-CM | POA: Diagnosis not present

## 2023-11-21 DIAGNOSIS — H353131 Nonexudative age-related macular degeneration, bilateral, early dry stage: Secondary | ICD-10-CM | POA: Diagnosis not present

## 2023-11-28 DIAGNOSIS — M4800 Spinal stenosis, site unspecified: Secondary | ICD-10-CM | POA: Diagnosis not present

## 2023-11-28 DIAGNOSIS — R29898 Other symptoms and signs involving the musculoskeletal system: Secondary | ICD-10-CM | POA: Diagnosis not present

## 2023-11-29 DIAGNOSIS — R29898 Other symptoms and signs involving the musculoskeletal system: Secondary | ICD-10-CM | POA: Diagnosis not present

## 2023-11-29 DIAGNOSIS — M4800 Spinal stenosis, site unspecified: Secondary | ICD-10-CM | POA: Diagnosis not present

## 2023-12-06 DIAGNOSIS — M4800 Spinal stenosis, site unspecified: Secondary | ICD-10-CM | POA: Diagnosis not present

## 2023-12-06 DIAGNOSIS — R29898 Other symptoms and signs involving the musculoskeletal system: Secondary | ICD-10-CM | POA: Diagnosis not present

## 2023-12-10 DIAGNOSIS — R29898 Other symptoms and signs involving the musculoskeletal system: Secondary | ICD-10-CM | POA: Diagnosis not present

## 2023-12-10 DIAGNOSIS — M4800 Spinal stenosis, site unspecified: Secondary | ICD-10-CM | POA: Diagnosis not present

## 2023-12-17 DIAGNOSIS — M4800 Spinal stenosis, site unspecified: Secondary | ICD-10-CM | POA: Diagnosis not present

## 2023-12-17 DIAGNOSIS — R29898 Other symptoms and signs involving the musculoskeletal system: Secondary | ICD-10-CM | POA: Diagnosis not present

## 2023-12-20 DIAGNOSIS — R29898 Other symptoms and signs involving the musculoskeletal system: Secondary | ICD-10-CM | POA: Diagnosis not present

## 2023-12-20 DIAGNOSIS — M4800 Spinal stenosis, site unspecified: Secondary | ICD-10-CM | POA: Diagnosis not present

## 2023-12-24 DIAGNOSIS — M4800 Spinal stenosis, site unspecified: Secondary | ICD-10-CM | POA: Diagnosis not present

## 2023-12-24 DIAGNOSIS — R29898 Other symptoms and signs involving the musculoskeletal system: Secondary | ICD-10-CM | POA: Diagnosis not present

## 2023-12-26 DIAGNOSIS — M4800 Spinal stenosis, site unspecified: Secondary | ICD-10-CM | POA: Diagnosis not present

## 2023-12-26 DIAGNOSIS — R29898 Other symptoms and signs involving the musculoskeletal system: Secondary | ICD-10-CM | POA: Diagnosis not present

## 2024-01-15 DIAGNOSIS — E785 Hyperlipidemia, unspecified: Secondary | ICD-10-CM | POA: Diagnosis not present

## 2024-01-15 DIAGNOSIS — K219 Gastro-esophageal reflux disease without esophagitis: Secondary | ICD-10-CM | POA: Diagnosis not present

## 2024-01-15 DIAGNOSIS — I10 Essential (primary) hypertension: Secondary | ICD-10-CM | POA: Diagnosis not present

## 2024-01-15 DIAGNOSIS — N182 Chronic kidney disease, stage 2 (mild): Secondary | ICD-10-CM | POA: Diagnosis not present

## 2024-01-15 DIAGNOSIS — I700 Atherosclerosis of aorta: Secondary | ICD-10-CM | POA: Diagnosis not present

## 2024-01-15 DIAGNOSIS — M4800 Spinal stenosis, site unspecified: Secondary | ICD-10-CM | POA: Diagnosis not present

## 2024-01-15 DIAGNOSIS — E038 Other specified hypothyroidism: Secondary | ICD-10-CM | POA: Diagnosis not present

## 2024-01-15 DIAGNOSIS — E1122 Type 2 diabetes mellitus with diabetic chronic kidney disease: Secondary | ICD-10-CM | POA: Diagnosis not present

## 2024-01-15 DIAGNOSIS — F411 Generalized anxiety disorder: Secondary | ICD-10-CM | POA: Diagnosis not present

## 2024-01-17 DIAGNOSIS — E785 Hyperlipidemia, unspecified: Secondary | ICD-10-CM | POA: Diagnosis not present

## 2024-01-17 DIAGNOSIS — E1122 Type 2 diabetes mellitus with diabetic chronic kidney disease: Secondary | ICD-10-CM | POA: Diagnosis not present

## 2024-01-17 DIAGNOSIS — I700 Atherosclerosis of aorta: Secondary | ICD-10-CM | POA: Diagnosis not present

## 2024-01-17 DIAGNOSIS — K219 Gastro-esophageal reflux disease without esophagitis: Secondary | ICD-10-CM | POA: Diagnosis not present

## 2024-01-17 DIAGNOSIS — N182 Chronic kidney disease, stage 2 (mild): Secondary | ICD-10-CM | POA: Diagnosis not present

## 2024-01-17 DIAGNOSIS — I10 Essential (primary) hypertension: Secondary | ICD-10-CM | POA: Diagnosis not present

## 2024-01-17 DIAGNOSIS — M4800 Spinal stenosis, site unspecified: Secondary | ICD-10-CM | POA: Diagnosis not present

## 2024-01-17 DIAGNOSIS — E038 Other specified hypothyroidism: Secondary | ICD-10-CM | POA: Diagnosis not present

## 2024-04-15 DIAGNOSIS — K219 Gastro-esophageal reflux disease without esophagitis: Secondary | ICD-10-CM | POA: Diagnosis not present

## 2024-04-15 DIAGNOSIS — I700 Atherosclerosis of aorta: Secondary | ICD-10-CM | POA: Diagnosis not present

## 2024-04-15 DIAGNOSIS — I10 Essential (primary) hypertension: Secondary | ICD-10-CM | POA: Diagnosis not present

## 2024-04-15 DIAGNOSIS — N182 Chronic kidney disease, stage 2 (mild): Secondary | ICD-10-CM | POA: Diagnosis not present

## 2024-04-15 DIAGNOSIS — E785 Hyperlipidemia, unspecified: Secondary | ICD-10-CM | POA: Diagnosis not present

## 2024-04-15 DIAGNOSIS — E1122 Type 2 diabetes mellitus with diabetic chronic kidney disease: Secondary | ICD-10-CM | POA: Diagnosis not present

## 2024-04-15 DIAGNOSIS — M4800 Spinal stenosis, site unspecified: Secondary | ICD-10-CM | POA: Diagnosis not present

## 2024-04-15 DIAGNOSIS — F411 Generalized anxiety disorder: Secondary | ICD-10-CM | POA: Diagnosis not present

## 2024-04-15 DIAGNOSIS — E038 Other specified hypothyroidism: Secondary | ICD-10-CM | POA: Diagnosis not present

## 2024-05-08 DIAGNOSIS — Z1231 Encounter for screening mammogram for malignant neoplasm of breast: Secondary | ICD-10-CM | POA: Diagnosis not present
